# Patient Record
Sex: Female | Born: 1990 | Race: Black or African American | Hispanic: No | Marital: Single | Smoking: Never smoker
Health system: Southern US, Community
[De-identification: ages and names within clinical notes are randomized; demographics above are authoritative.]

## PROBLEM LIST (undated history)

## (undated) ENCOUNTER — Inpatient Hospital Stay (HOSPITAL_COMMUNITY): Payer: Self-pay

## (undated) DIAGNOSIS — J45909 Unspecified asthma, uncomplicated: Secondary | ICD-10-CM

## (undated) DIAGNOSIS — G35 Multiple sclerosis: Secondary | ICD-10-CM

---
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
## 2010-02-26 HISTORY — PX: OTHER SURGICAL HISTORY: SHX169

## 2015-02-27 NOTE — L&D Delivery Note (Signed)
Delivery Note At 3:02 AM a viable female was delivered via Vaginal, Spontaneous Delivery (Presentation: vertex; OA).  APGAR: 9, 9; weight pending.   Placenta status: spontaneous, intact.  Cord: 3 vessel with the following complications: none.  Cord pH: not obtained  Anesthesia:  Epidural Episiotomy: None Lacerations: None;Periurethral abraison, not repaired. Suture Repair: n/a Est. Blood Loss (mL): 50  Mom to postpartum.  Baby to Couplet care / Skin to Skin.  Jinny Blossom Mayo 12/10/2015, 3:14 AM  Patient is a [id number] at [redacted]w[redacted]d who was admitted in active labor, significant hx of elevated glucola with no GTT, maternal MS.  She progressed without augmentation.  I was gloved and present for delivery in its entirety.  Second stage of labor progressed to SVD;  mild decels during second stage noted.  Complications: none  Lacerations: none  EBL: 50cc  Cam Hai, CNM 9:55 AM  12/10/2015

## 2015-10-18 ENCOUNTER — Encounter (HOSPITAL_COMMUNITY): Payer: Self-pay

## 2015-10-18 ENCOUNTER — Inpatient Hospital Stay (HOSPITAL_COMMUNITY)
Admission: AD | Admit: 2015-10-18 | Discharge: 2015-10-18 | Disposition: A | Discharge status: Home / Self Care | Payer: Self-pay | Source: Ambulatory Visit | Attending: Family Medicine | Admitting: Family Medicine

## 2015-10-18 DIAGNOSIS — O99613 Diseases of the digestive system complicating pregnancy, third trimester: Secondary | ICD-10-CM

## 2015-10-18 DIAGNOSIS — Z3A31 31 weeks gestation of pregnancy: Secondary | ICD-10-CM | POA: Insufficient documentation

## 2015-10-18 DIAGNOSIS — O26893 Other specified pregnancy related conditions, third trimester: Secondary | ICD-10-CM | POA: Insufficient documentation

## 2015-10-18 DIAGNOSIS — R1032 Left lower quadrant pain: Secondary | ICD-10-CM | POA: Insufficient documentation

## 2015-10-18 DIAGNOSIS — Z91013 Allergy to seafood: Secondary | ICD-10-CM | POA: Insufficient documentation

## 2015-10-18 DIAGNOSIS — K529 Noninfective gastroenteritis and colitis, unspecified: Secondary | ICD-10-CM

## 2015-10-18 DIAGNOSIS — O26899 Other specified pregnancy related conditions, unspecified trimester: Secondary | ICD-10-CM

## 2015-10-18 DIAGNOSIS — O0933 Supervision of pregnancy with insufficient antenatal care, third trimester: Secondary | ICD-10-CM

## 2015-10-18 DIAGNOSIS — G35 Multiple sclerosis: Secondary | ICD-10-CM | POA: Insufficient documentation

## 2015-10-18 DIAGNOSIS — R109 Unspecified abdominal pain: Secondary | ICD-10-CM

## 2015-10-18 DIAGNOSIS — O99353 Diseases of the nervous system complicating pregnancy, third trimester: Secondary | ICD-10-CM | POA: Insufficient documentation

## 2015-10-18 DIAGNOSIS — R197 Diarrhea, unspecified: Secondary | ICD-10-CM

## 2015-10-18 DIAGNOSIS — O99019 Anemia complicating pregnancy, unspecified trimester: Secondary | ICD-10-CM

## 2015-10-18 DIAGNOSIS — D649 Anemia, unspecified: Secondary | ICD-10-CM | POA: Insufficient documentation

## 2015-10-18 DIAGNOSIS — O99013 Anemia complicating pregnancy, third trimester: Secondary | ICD-10-CM | POA: Insufficient documentation

## 2015-10-18 DIAGNOSIS — R1031 Right lower quadrant pain: Secondary | ICD-10-CM | POA: Insufficient documentation

## 2015-10-18 HISTORY — DX: Multiple sclerosis: G35

## 2015-10-18 HISTORY — DX: Unspecified asthma, uncomplicated: J45.909

## 2015-10-18 LAB — CBC WITH DIFFERENTIAL/PLATELET
Basophils Absolute: 0 10*3/uL (ref 0.0–0.1)
Basophils Relative: 0 %
Eosinophils Absolute: 0.1 10*3/uL (ref 0.0–0.7)
Eosinophils Relative: 1 %
HCT: 27.2 % — ABNORMAL LOW (ref 36.0–46.0)
Hemoglobin: 9.3 g/dL — ABNORMAL LOW (ref 12.0–15.0)
Lymphocytes Relative: 29 %
Lymphs Abs: 2.9 10*3/uL (ref 0.7–4.0)
MCH: 28.7 pg (ref 26.0–34.0)
MCHC: 34.2 g/dL (ref 30.0–36.0)
MCV: 84 fL (ref 78.0–100.0)
Monocytes Absolute: 0.6 10*3/uL (ref 0.1–1.0)
Monocytes Relative: 6 %
Neutro Abs: 6.4 10*3/uL (ref 1.7–7.7)
Neutrophils Relative %: 64 %
Platelets: 287 10*3/uL (ref 150–400)
RBC: 3.24 MIL/uL — ABNORMAL LOW (ref 3.87–5.11)
RDW: 13.4 % (ref 11.5–15.5)
WBC: 10 10*3/uL (ref 4.0–10.5)

## 2015-10-18 LAB — URINALYSIS, ROUTINE W REFLEX MICROSCOPIC
Bilirubin Urine: NEGATIVE
Glucose, UA: 100 mg/dL — AB
Hgb urine dipstick: NEGATIVE
Ketones, ur: NEGATIVE mg/dL
Leukocytes, UA: NEGATIVE
Nitrite: NEGATIVE
Protein, ur: NEGATIVE mg/dL
Specific Gravity, Urine: >1.03 — ABNORMAL HIGH (ref 1.005–1.030)
pH: 6 (ref 5.0–8.0)

## 2015-10-18 MED ORDER — FERROUS SULFATE 325 (65 FE) MG PO TABS: 325.0000 mg | ORAL_TABLET | Freq: Two times a day (BID) | ORAL | 1 refills | Status: DC

## 2015-10-18 NOTE — MAU Note (Addendum)
Patient presents with c/o lower sharp abdominal pains that started yesterday at 2100. Patient states the pain was constant this morning but now comes and goes. Patient denies any bleeding or LOF. Fetus active. Patient also states that she has been SOB for the past 2 weeks.

## 2015-10-18 NOTE — Discharge Instructions (Signed)
Third Trimester of Pregnancy °The third trimester is from week 29 through week 42, months 7 through 9. The third trimester is a time when the fetus is growing rapidly. At the end of the ninth month, the fetus is about 20 inches in length and weighs 6-10 pounds.  °BODY CHANGES °Your body goes through many changes during pregnancy. The changes vary from woman to woman.  °· Your weight will continue to increase. You can expect to gain 25-35 pounds (11-16 kg) by the end of the pregnancy. °· You may begin to get stretch marks on your hips, abdomen, and breasts. °· You may urinate more often because the fetus is moving lower into your pelvis and pressing on your bladder. °· You may develop or continue to have heartburn as a result of your pregnancy. °· You may develop constipation because certain hormones are causing the muscles that push waste through your intestines to slow down. °· You may develop hemorrhoids or swollen, bulging veins (varicose veins). °· You may have pelvic pain because of the weight gain and pregnancy hormones relaxing your joints between the bones in your pelvis. Backaches may result from overexertion of the muscles supporting your posture. °· You may have changes in your hair. These can include thickening of your hair, rapid growth, and changes in texture. Some women also have hair loss during or after pregnancy, or hair that feels dry or thin. Your hair will most likely return to normal after your baby is born. °· Your breasts will continue to grow and be tender. A yellow discharge may leak from your breasts called colostrum. °· Your belly button may stick out. °· You may feel short of breath because of your expanding uterus. °· You may notice the fetus "dropping," or moving lower in your abdomen. °· You may have a bloody mucus discharge. This usually occurs a few days to a week before labor begins. °· Your cervix becomes thin and soft (effaced) near your due date. °WHAT TO EXPECT AT YOUR PRENATAL  EXAMS  °You will have prenatal exams every 2 weeks until week 36. Then, you will have weekly prenatal exams. During a routine prenatal visit: °· You will be weighed to make sure you and the fetus are growing normally. °· Your blood pressure is taken. °· Your abdomen will be measured to track your baby's growth. °· The fetal heartbeat will be listened to. °· Any test results from the previous visit will be discussed. °· You may have a cervical check near your due date to see if you have effaced. °At around 36 weeks, your caregiver will check your cervix. At the same time, your caregiver will also perform a test on the secretions of the vaginal tissue. This test is to determine if a type of bacteria, Group B streptococcus, is present. Your caregiver will explain this further. °Your caregiver may ask you: °· What your birth plan is. °· How you are feeling. °· If you are feeling the baby move. °· If you have had any abnormal symptoms, such as leaking fluid, bleeding, severe headaches, or abdominal cramping. °· If you are using any tobacco products, including cigarettes, chewing tobacco, and electronic cigarettes. °· If you have any questions. °Other tests or screenings that may be performed during your third trimester include: °· Blood tests that check for low iron levels (anemia). °· Fetal testing to check the health, activity level, and growth of the fetus. Testing is done if you have certain medical conditions or if   there are problems during the pregnancy. °· HIV (human immunodeficiency virus) testing. If you are at high risk, you may be screened for HIV during your third trimester of pregnancy. °FALSE LABOR °You may feel small, irregular contractions that eventually go away. These are called Braxton Hicks contractions, or false labor. Contractions may last for hours, days, or even weeks before true labor sets in. If contractions come at regular intervals, intensify, or become painful, it is best to be seen by your  caregiver.  °SIGNS OF LABOR  °· Menstrual-like cramps. °· Contractions that are 5 minutes apart or less. °· Contractions that start on the top of the uterus and spread down to the lower abdomen and back. °· A sense of increased pelvic pressure or back pain. °· A watery or bloody mucus discharge that comes from the vagina. °If you have any of these signs before the 37th week of pregnancy, call your caregiver right away. You need to go to the hospital to get checked immediately. °HOME CARE INSTRUCTIONS  °· Avoid all smoking, herbs, alcohol, and unprescribed drugs. These chemicals affect the formation and growth of the baby. °· Do not use any tobacco products, including cigarettes, chewing tobacco, and electronic cigarettes. If you need help quitting, ask your health care provider. You may receive counseling support and other resources to help you quit. °· Follow your caregiver's instructions regarding medicine use. There are medicines that are either safe or unsafe to take during pregnancy. °· Exercise only as directed by your caregiver. Experiencing uterine cramps is a good sign to stop exercising. °· Continue to eat regular, healthy meals. °· Wear a good support bra for breast tenderness. °· Do not use hot tubs, steam rooms, or saunas. °· Wear your seat belt at all times when driving. °· Avoid raw meat, uncooked cheese, cat litter boxes, and soil used by cats. These carry germs that can cause birth defects in the baby. °· Take your prenatal vitamins. °· Take 1500-2000 mg of calcium daily starting at the 20th week of pregnancy until you deliver your baby. °· Try taking a stool softener (if your caregiver approves) if you develop constipation. Eat more high-fiber foods, such as fresh vegetables or fruit and whole grains. Drink plenty of fluids to keep your urine clear or pale yellow. °· Take warm sitz baths to soothe any pain or discomfort caused by hemorrhoids. Use hemorrhoid cream if your caregiver approves. °· If  you develop varicose veins, wear support hose. Elevate your feet for 15 minutes, 3-4 times a day. Limit salt in your diet. °· Avoid heavy lifting, wear low heal shoes, and practice good posture. °· Rest a lot with your legs elevated if you have leg cramps or low back pain. °· Visit your dentist if you have not gone during your pregnancy. Use a soft toothbrush to brush your teeth and be gentle when you floss. °· A sexual relationship may be continued unless your caregiver directs you otherwise. °· Do not travel far distances unless it is absolutely necessary and only with the approval of your caregiver. °· Take prenatal classes to understand, practice, and ask questions about the labor and delivery. °· Make a trial run to the hospital. °· Pack your hospital bag. °· Prepare the baby's nursery. °· Continue to go to all your prenatal visits as directed by your caregiver. °SEEK MEDICAL CARE IF: °· You are unsure if you are in labor or if your water has broken. °· You have dizziness. °· You have   mild pelvic cramps, pelvic pressure, or nagging pain in your abdominal area.  You have persistent nausea, vomiting, or diarrhea.  You have a bad smelling vaginal discharge.  You have pain with urination. SEEK IMMEDIATE MEDICAL CARE IF:   You have a fever.  You are leaking fluid from your vagina.  You have spotting or bleeding from your vagina.  You have severe abdominal cramping or pain.  You have rapid weight loss or gain.  You have shortness of breath with chest pain.  You notice sudden or extreme swelling of your face, hands, ankles, feet, or legs.  You have not felt your baby move in over an hour.  You have severe headaches that do not go away with medicine.  You have vision changes.   This information is not intended to replace advice given to you by your health care provider. Make sure you discuss any questions you have with your health care provider.   Document Released: 02/06/2001 Document  Revised: 03/05/2014 Document Reviewed: 04/15/2012 Elsevier Interactive Patient Education 2016 [employer] Diarrhea Diarrhea is frequent loose and watery bowel movements. It can cause you to feel weak and dehydrated. Dehydration can cause you to become tired and thirsty, have a dry mouth, and have decreased urination that often is dark yellow. Diarrhea is a sign of another problem, most often an infection that will not last long. In most cases, diarrhea typically lasts 2-3 days. However, it can last longer if it is a sign of something more serious. It is important to treat your diarrhea as directed by your caregiver to lessen or prevent future episodes of diarrhea. CAUSES  Some common causes include:  Gastrointestinal infections caused by viruses, bacteria, or parasites.  Food poisoning or food allergies.  Certain medicines, such as antibiotics, chemotherapy, and laxatives.  Artificial sweeteners and fructose.  Digestive disorders. HOME CARE INSTRUCTIONS  Ensure adequate fluid intake (hydration): Have 1 cup (8 oz) of fluid for each diarrhea episode. Avoid fluids that contain simple sugars or sports drinks, fruit juices, whole milk products, and sodas. Your urine should be clear or pale yellow if you are drinking enough fluids. Hydrate with an oral rehydration solution that you can purchase at pharmacies, retail stores, and online. You can prepare an oral rehydration solution at home by mixing the following ingredients together:   - tsp table salt.   tsp baking soda.   tsp salt substitute containing potassium chloride.  1  tablespoons sugar.  1 L (34 oz) of water.  Certain foods and beverages may increase the speed at which food moves through the gastrointestinal (GI) tract. These foods and beverages should be avoided and include:  Caffeinated and alcoholic beverages.  High-fiber foods, such as raw fruits and vegetables, nuts, seeds, and whole grain breads and cereals.  Foods  and beverages sweetened with sugar alcohols, such as xylitol, sorbitol, and mannitol.  Some foods may be well tolerated and may help thicken stool including:  Starchy foods, such as rice, toast, pasta, low-sugar cereal, oatmeal, grits, baked potatoes, crackers, and bagels.  Bananas.  Applesauce.  Add probiotic-rich foods to help increase healthy bacteria in the GI tract, such as yogurt and fermented milk products.  Wash your hands well after each diarrhea episode.  Only take over-the-counter or prescription medicines as directed by your caregiver.  Take a warm bath to relieve any burning or pain from frequent diarrhea episodes. SEEK IMMEDIATE MEDICAL CARE IF:   You are unable to keep fluids down.  You have  persistent vomiting.  You have blood in your stool, or your stools are black and tarry.  You do not urinate in 6-8 hours, or there is only a small amount of very dark urine.  You have abdominal pain that increases or localizes.  You have weakness, dizziness, confusion, or light-headedness.  You have a severe headache.  Your diarrhea gets worse or does not get better.  You have a fever or persistent symptoms for more than 2-3 days.  You have a fever and your symptoms suddenly get worse. MAKE SURE YOU:   Understand these instructions.  Will watch your condition.  Will get help right away if you are not doing well or get worse.   This information is not intended to replace advice given to you by your health care provider. Make sure you discuss any questions you have with your health care provider.   Document Released: 02/02/2002 Document Revised: 03/05/2014 Document Reviewed: 10/21/2011 Elsevier Interactive Patient Education [employer]

## 2015-10-18 NOTE — MAU Provider Note (Signed)
Chief Complaint:  Abdominal Pain   Seen by provider at 1510 hours   HPI: Molly MansonJewel Moreno is a 25 y.o. [phone] [id number] at [id number] presents to maternity admissions reporting abdominal pains which are sharp and located in right lower side.  Good FM.  Some shortness of breath at times, but not dyspneic.  She reports good fetal movement, denies LOF, vaginal bleeding, vaginal itching/burning, urinary symptoms, h/a, dizziness, n/v, constipation or fever/chills.  States had two loose stools.  She denies headache, visual changes or RUQ abdominal pain.    Got prenatal care in [place] until 24 weeks then stopped. Moved here. Has not gotten care yet.  States [facility] told her not to bother since it was so late, "you should just wait until you go into labor". States her husband does not want to sign up for "public assistance", so she is self-pay, worried about costs.  Abdominal Cramping  This is a new problem. The current episode started 1 to 4 weeks ago. The onset quality is gradual. The problem occurs intermittently. The problem has been waxing and waning. The pain is located in the LLQ and RLQ. The quality of the pain is cramping. The abdominal pain does not radiate. Pertinent negatives include no constipation, diarrhea, fever, frequency, hematuria, myalgias, nausea or vomiting. The pain is aggravated by palpation. The pain is relieved by nothing. She has tried nothing for the symptoms.    RN Note: Patient presents with c/o lower sharp abdominal pains that started yesterday at 2100. Patient states the pain was constant this morning but now comes and goes. Patient denies any bleeding or LOF. Fetus active. Patient also states that she has been SOB for the past 2 weeks  Past Medical History: Past Medical History:  Diagnosis Date  . Asthma   . Multiple sclerosis (HCC)     Past obstetric history: OB History  Gravida Para Term Preterm AB Living  4       1 2   SAB TAB Ectopic Multiple Live Births      1         # Outcome Date GA Lbr Len/2nd Weight Sex Delivery Anes PTL Lv  4 Current           3 Gravida           2 Gravida           1 Ectopic               Past Surgical History: History reviewed. No pertinent surgical history.  Family History: History reviewed. No pertinent family history.  Social History: Social History  Substance Use Topics  . Smoking status: Never Smoker  . Smokeless tobacco: Never Used  . Alcohol use Not on file    Allergies:  Allergies  Allergen Reactions  . Shellfish Allergy Swelling    Meds:  Prescriptions Prior to Admission  Medication Sig Dispense Refill Last Dose  . calcium carbonate (TUMS - DOSED IN MG ELEMENTAL CALCIUM) 500 MG chewable tablet Chew 2 tablets by mouth 2 (two) times daily as needed for indigestion or heartburn.   10/18/2015 at Unknown time  . Prenatal Vit-Fe Fumarate-FA (PRENATAL MULTIVITAMIN) TABS tablet Take 1 tablet by mouth daily at 12 noon.   10/17/2015 at Unknown time    I have reviewed patient's Past Medical Hx, Surgical Hx, Family Hx, Social Hx, medications and allergies.   ROS:  Review of Systems  Constitutional: Negative for fever.  Gastrointestinal: Negative for constipation, diarrhea, nausea and vomiting.  Genitourinary: Negative for frequency and hematuria.  Musculoskeletal: Negative for myalgias.   Other systems negative  Physical Exam  Patient Vitals for the past 24 hrs:  BP Temp Temp src Pulse Resp  10/18/15 1519 115/71 97.6 F (36.4 C) Oral 102 18   Constitutional: Well-developed, well-nourished female in no acute distress.  Cardiovascular: normal rate and rhythm Respiratory: normal effort, clear to auscultation bilaterally GI: Abd soft, non-tender except for over round ligaments,  gravid appropriate for gestational age.   No rebound or guarding. MS: Extremities nontender, no edema, normal ROM Neurologic: Alert and oriented x 4.  GU: Neg CVAT.  PELVIC EXAM: Cervix pink, visually closed, without lesion,  scant white creamy discharge, vaginal walls and external genitalia normal  FHT:  Baseline 130 , moderate variability, accelerations present, no decelerations Contractions:   Rare   Labs:    Results for orders placed or performed during the hospital encounter of 10/18/15 (from the past 72 hour(s))  Urinalysis, Routine w reflex microscopic (not at [facility])     Status: Abnormal   Collection Time: 10/18/15  3:07 PM  Result Value Ref Range   Color, Urine YELLOW YELLOW   APPearance CLEAR CLEAR   Specific Gravity, Urine >1.030 (H) 1.005 - 1.030   pH 6.0 5.0 - 8.0   Glucose, UA 100 (A) NEGATIVE mg/dL   Hgb urine dipstick NEGATIVE NEGATIVE   Bilirubin Urine NEGATIVE NEGATIVE   Ketones, ur NEGATIVE NEGATIVE mg/dL   Protein, ur NEGATIVE NEGATIVE mg/dL   Nitrite NEGATIVE NEGATIVE   Leukocytes, UA NEGATIVE NEGATIVE    Comment: MICROSCOPIC NOT DONE ON URINES WITH NEGATIVE PROTEIN, BLOOD, LEUKOCYTES, NITRITE, OR GLUCOSE <1000 mg/dL.  CBC with Differential/Platelet     Status: Abnormal   Collection Time: 10/18/15  4:25 PM  Result Value Ref Range   WBC 10.0 4.0 - 10.5 K/uL   RBC 3.24 (L) 3.87 - 5.11 MIL/uL   Hemoglobin 9.3 (L) 12.0 - 15.0 g/dL   HCT 94.1 (L) 74.0 - 81.4 %   MCV 84.0 78.0 - 100.0 fL   MCH 28.7 26.0 - 34.0 pg   MCHC 34.2 30.0 - 36.0 g/dL   RDW 48.1 85.6 - 31.4 %   Platelets 287 150 - 400 K/uL   Neutrophils Relative % 64 %   Neutro Abs 6.4 1.7 - 7.7 K/uL   Lymphocytes Relative 29 %   Lymphs Abs 2.9 0.7 - 4.0 K/uL   Monocytes Relative 6 %   Monocytes Absolute 0.6 0.1 - 1.0 K/uL   Eosinophils Relative 1 %   Eosinophils Absolute 0.1 0.0 - 0.7 K/uL   Basophils Relative 0 %   Basophils Absolute 0.0 0.0 - 0.1 K/uL     Imaging:  No results found.  MAU Course/MDM: I have ordered labs and reviewed results. Checked CBC and urine to rule out appendicitis and UTI BOth normal and no overt evidence of infectious etiology Suspect round ligament pain NST reviewed  Assessment: [facility]  at [redacted]w[redacted]d Possible food poisoning or gastroenteritis, resolving Abdominal pain, likely round ligament pain Anemia  Plan: Discharge home Rx Ferrous sulfate for anemia Preterm Labor precautions and fetal kick counts Follow up in Office for prenatal visits and recheck of status Message sent to clinic to get new ob appt  Pt stable at time of discharge  Encouraged to return here or to other Urgent Care/ED if she develops worsening of symptoms, increase in pain, fever, or other concerning symptoms.    Wynelle Bourgeois CNM, MSN Certified  Nurse-Midwife 10/18/2015 4:22 PM

## 2015-10-21 ENCOUNTER — Encounter: Payer: Self-pay | Admitting: *Deleted

## 2015-11-09 ENCOUNTER — Ambulatory Visit (INDEPENDENT_AMBULATORY_CARE_PROVIDER_SITE_OTHER): Payer: Self-pay | Admitting: Clinical

## 2015-11-09 ENCOUNTER — Ambulatory Visit (INDEPENDENT_AMBULATORY_CARE_PROVIDER_SITE_OTHER): Payer: Self-pay | Admitting: Obstetrics and Gynecology

## 2015-11-09 ENCOUNTER — Encounter: Payer: Self-pay | Admitting: Obstetrics and Gynecology

## 2015-11-09 VITALS — BP 112/61 | HR 90 | Ht 59.0 in | Wt 173.0 lb

## 2015-11-09 DIAGNOSIS — D649 Anemia, unspecified: Secondary | ICD-10-CM

## 2015-11-09 DIAGNOSIS — G35 Multiple sclerosis: Secondary | ICD-10-CM

## 2015-11-09 DIAGNOSIS — O2343 Unspecified infection of urinary tract in pregnancy, third trimester: Secondary | ICD-10-CM

## 2015-11-09 DIAGNOSIS — Z113 Encounter for screening for infections with a predominantly sexual mode of transmission: Secondary | ICD-10-CM

## 2015-11-09 DIAGNOSIS — R82998 Other abnormal findings in urine: Secondary | ICD-10-CM

## 2015-11-09 DIAGNOSIS — Z3490 Encounter for supervision of normal pregnancy, unspecified, unspecified trimester: Secondary | ICD-10-CM | POA: Insufficient documentation

## 2015-11-09 DIAGNOSIS — O99353 Diseases of the nervous system complicating pregnancy, third trimester: Secondary | ICD-10-CM

## 2015-11-09 DIAGNOSIS — O99019 Anemia complicating pregnancy, unspecified trimester: Secondary | ICD-10-CM | POA: Insufficient documentation

## 2015-11-09 DIAGNOSIS — F4323 Adjustment disorder with mixed anxiety and depressed mood: Secondary | ICD-10-CM

## 2015-11-09 DIAGNOSIS — O99013 Anemia complicating pregnancy, third trimester: Secondary | ICD-10-CM

## 2015-11-09 LAB — CBC
HCT: 29.3 % — ABNORMAL LOW (ref 35.0–45.0)
Hemoglobin: 9.5 g/dL — ABNORMAL LOW (ref 11.7–15.5)
MCH: 27.5 pg (ref 27.0–33.0)
MCHC: 32.4 g/dL (ref 32.0–36.0)
MCV: 84.7 fL (ref 80.0–100.0)
MPV: 10 fL (ref 7.5–12.5)
PLATELETS: 328 10*3/uL (ref 140–400)
RBC: 3.46 MIL/uL — AB (ref 3.80–5.10)
RDW: 13.7 % (ref 11.0–15.0)
WBC: 9.3 10*3/uL (ref 3.8–10.8)

## 2015-11-09 LAB — POCT URINALYSIS DIP (DEVICE)
Bilirubin Urine: NEGATIVE
Glucose, UA: NEGATIVE mg/dL
Hgb urine dipstick: NEGATIVE
Ketones, ur: NEGATIVE mg/dL
Nitrite: NEGATIVE
Protein, ur: NEGATIVE mg/dL
Specific Gravity, Urine: 1.025 (ref 1.005–1.030)
Urobilinogen, UA: 0.2 mg/dL (ref 0.0–1.0)
pH: 7 (ref 5.0–8.0)

## 2015-11-09 NOTE — Progress Notes (Deleted)
  Subjective:    Molly Moreno is a [id number] [redacted]w[redacted]d being seen today for her first obstetrical visit.  Her obstetrical history is significant for anemia; she is a transfer of care from [place], records have been requested. Her prenatal history includes one full term vaginal delivery and one preterm vaginal delivery @ 36 weeks. Patient brought with her today some labs that were done at previous prenatal visit. Patient states she had one [place] between 18 weeks and 19 weeks and it was "normal". . Patient unsure intend to breast feed. Pregnancy history fully reviewed.  Patient reports no complaints.  Vitals:   11/09/15 0821 11/09/15 0823  BP: 112/61   Pulse: 90   Weight: 173 lb (78.5 kg)   Height:  4\' 11"  (1.499 m)    HISTORY: OB History  Gravida Para Term Preterm AB Living  5 2 1 1 2 2   SAB TAB Ectopic Multiple Live Births    1 1   2     # Outcome Date GA Lbr Len/2nd Weight Sex Delivery Anes PTL Lv  5 Current           4 TAB 2015          3 Ectopic 2012          2 Term 09/07/09 [redacted]w[redacted]d   F Vag-Spont EPI N LIV  1 Preterm 05/19/05 [redacted]w[redacted]d   M Vag-Spont None Y LIV     Past Medical History:  Diagnosis Date  . Asthma   . Multiple sclerosis [facility])    Past Surgical History:  Procedure Laterality Date  . salphinectomy Right 2012   ruptured ectopic   Family History  Problem Relation Age of Onset  . Cancer Maternal Grandmother     colon  . Cancer Maternal Grandfather     colon    Exam    Uterus:  Fundal Height: 33 cm   Breast:  Deferred    Skin: normal coloration and turgor, no rashes    Neurologic: oriented, normal   Extremities: normal strength, tone, and muscle mass   HEENT PERRLA   Mouth/Teeth mucous membranes moist, pharynx normal without lesions   Neck supple and no masses   Cardiovascular: regular rate and rhythm   Respiratory:  appears well, vitals normal, no respiratory distress, acyanotic, normal RR","ear and throat exam is normal","neck free of mass or  lymphadenopathy","chest clear, no wheezing, crepitations, rhonchi, normal symmetric air entry   Abdomen: soft, non-tender; bowel sounds normal; no masses,  no organomegaly   Urinary: deferred       Assessment:    Pregnancy: [id number] Patient Active Problem List   Diagnosis Date Noted  . Supervision of low-risk pregnancy 11/09/2015  . Anemia affecting pregnancy, antepartum 11/09/2015    Plan:    1. Supervision of low-risk pregnancy, unspecified trimester  - Glucose Tolerance, 1 HR (50g) - HIV antibody (with reflex) - CBC - RPR - Urine Culture - GC/Chlamydia probe amp ([place])not at [facility]  2. Leukocytes in urine  - Urine Culture  Prenatal vitamins. Problem list reviewed and updated. Genetic Screening Records requested from [place], [place] to call and request records faxed to [facility] Preterm labor precautions  Return in 2 weeks    Duane Lope, NP

## 2015-11-09 NOTE — Progress Notes (Signed)
Subjective:    Molly Moreno is a [id number] @ [redacted]w[redacted]d being seen today for her first obstetrical visit at [facility].  Her obstetrical history is significant for anemia, multiple sclerosis HCC, and asthma. She is a transfer of care from [facility], records have been requested. Her last reported visit with them was at 24 weeks.  Her prenatal history includes one full term vaginal delivery and one preterm vaginal delivery @ 36 weeks. Patient states she had one [place] between 18 or 19 weeks and it was "normal". . Patient intends to breast feed. Pregnancy history fully reviewed.  Patient reports no complaints.  Vitals:   11/09/15 0821 11/09/15 0823  BP: 112/61   Pulse: 90   Weight: 173 lb (78.5 kg)   Height:  4\' 11"  (1.499 m)    HISTORY: OB History  Gravida Para Term Preterm AB Living  5 2 1 1 2 2   SAB TAB Ectopic Multiple Live Births    1 1   2     # Outcome Date GA Lbr Len/2nd Weight Sex Delivery Anes PTL Lv  5 Current           4 TAB 2015          3 Ectopic 2012          2 Term 09/07/09 [redacted]w[redacted]d   F Vag-Spont EPI N LIV  1 Preterm 05/19/05 [redacted]w[redacted]d   M Vag-Spont None Y LIV     Past Medical History:  Diagnosis Date  . Asthma   . Multiple sclerosis [facility])    Past Surgical History:  Procedure Laterality Date  . salphinectomy Right 2012   ruptured ectopic   Family History  Problem Relation Age of Onset  . Cancer Maternal Grandmother     colon  . Cancer Maternal Grandfather     colon    Exam    Uterus:  Fundal Height: 33 cm   Breast:  Deferred    Skin: normal coloration and turgor, no rashes    Neurologic: oriented, normal   Extremities: normal strength, tone, and muscle mass   HEENT PERRLA   Mouth/Teeth mucous membranes moist, pharynx normal without lesions   Neck supple and no masses   Cardiovascular: regular rate and rhythm   Respiratory:  appears well, vitals normal, no respiratory distress, acyanotic, normal RR","ear and throat exam is normal","neck free of mass or  lymphadenopathy","chest clear, no wheezing, crepitations, rhonchi, normal symmetric air entry   Abdomen: soft, non-tender; bowel sounds normal; no masses,  no organomegaly   Urinary: deferred       Assessment:    Pregnancy: [id number] Patient Active Problem List   Diagnosis Date Noted  . Supervision of low-risk pregnancy 11/09/2015  . Anemia affecting pregnancy, antepartum 11/09/2015  . MS (multiple sclerosis) (HCC) 11/09/2015    Plan:    1. Supervision of low-risk pregnancy, unspecified trimester - Glucose Tolerance, 1 HR (50g) - HIV antibody (with reflex) - CBC - RPR - Urine Culture - GC/Chlamydia probe amp ([place])not at [facility] - She is self pay and is concerned about not having records from [place] and having to possibly repeat labs/ [place]   2. Leukocytes in urine - Urine Culture  3. Anemia affecting pregnancy  - Continue PO iron   4.  Multiple Sclerosis HCC - Patient not interested in seeing neurology for treatment. She would like to discuss this more after pregnancy.  - Father of the baby has MS as well   Prenatal vitamins. Problem list reviewed and  updated. [place] [facility] Preterm labor precautions  Return in 2 weeks    Duane LopeJennifer I Jarquis Walker, NP

## 2015-11-09 NOTE — BH Specialist Note (Signed)
  ASSESSMENT: Pt currently experiencing Adjustment disorder with mixed anxious and depressed mood.Pt needs to f/u with OB. Pt would benefit from psychoeducation and brief therapeutic intervention regarding coping with symptoms of anxiety and depression.  Stage of Change: contemplative  PLAN: 1. F/U with behavioral health clinician as needed 2. Psychiatric Medications: none 3. Behavioral recommendations:   -Read educational material regarding coping with mild symptoms of anxiety and depression -Consider [facility] as resource for questions about BH meds and postpartum, as needed -Consider MomTalk mom-led support group at [facility], postpartum, as needed  SUBJECTIVE: Pt. referred by Venia Carbon, NP, for symptoms of anxiety and depression Pt. reports the following symptoms/concerns: Pt states that she has previously been treated for anxiety and depression prior to pregnancy; attributes recent increase in symptoms to third trimester pregnancy, but coping well without meds. Duration of problem: Increase in past month Severity: mild   OBJECTIVE: Orientation & Cognition: Oriented x3. Thought processes normal and appropriate to situation. Mood: appropriate Affect: appropriate Appearance: appropriate Risk of harm to self or others: no known risk of harm to self or others Substance use: none Assessments administered: PHQ: 12/ GAD7: 12  Diagnosis: Adjustment disorder with mixed anxious and depressed mood CPT Code: F43.23  -------------------------------------------- Other(s) present in the room: FOB  Time spent with patient in exam room: 16 minutes, 9-9:16am  Depression screen [facility] 2/9 11/09/2015  Decreased Interest 3  Down, Depressed, Hopeless 0  PHQ - 2 Score 3  Altered sleeping 3  Tired, decreased energy 3  Change in appetite 1  Feeling bad or failure about yourself  0  Trouble concentrating 1  Moving slowly or fidgety/restless 1  Suicidal thoughts 0  PHQ-9 Score 12    GAD 7 : Generalized Anxiety Score 11/09/2015  Control/stop worrying 1  Worry too much - different things 1  Trouble relaxing 3  Restless 3  Easily annoyed or irritable 3  Afraid - awful might happen 1

## 2015-11-09 NOTE — Progress Notes (Signed)
Declines flu vaccine, wants to think about tdap vaccine Plans to breastfeed

## 2015-11-09 NOTE — Patient Instructions (Signed)

## 2015-11-10 LAB — GC/CHLAMYDIA PROBE AMP (~~LOC~~) NOT AT ARMC
CHLAMYDIA, DNA PROBE: NEGATIVE
NEISSERIA GONORRHEA: NEGATIVE

## 2015-11-10 LAB — RPR

## 2015-11-10 LAB — GLUCOSE TOLERANCE, 1 HOUR (50G) W/O FASTING: GLUCOSE, 1 HR, GESTATIONAL: 150 mg/dL — AB (ref <140)

## 2015-11-10 LAB — URINE CULTURE

## 2015-11-10 LAB — HIV ANTIBODY (ROUTINE TESTING W REFLEX): HIV: NONREACTIVE

## 2015-11-11 ENCOUNTER — Telehealth: Payer: Self-pay | Admitting: *Deleted

## 2015-11-11 NOTE — Telephone Encounter (Signed)
Called patient and notified her of gtt results and need for 3 hour test. Explained that this will be a fasting test. Patient voiced understanding and said she could come in on Monday @ 8am.

## 2015-11-11 NOTE — Telephone Encounter (Signed)
-----   Message from Duane Lope, NP sent at 11/10/2015  1:12 PM EDT ----- Abnormal 1 hour GTT, She needs a 3 hour GTT She is already 34 weeks, can we schedule her as soon as possible please?  Thank you.

## 2015-11-15 ENCOUNTER — Other Ambulatory Visit: Payer: Self-pay

## 2015-11-29 ENCOUNTER — Inpatient Hospital Stay (HOSPITAL_COMMUNITY)
Admission: AD | Admit: 2015-11-29 | Discharge: 2015-11-29 | Disposition: A | Discharge status: Home / Self Care | Payer: Self-pay | Source: Ambulatory Visit | Attending: Obstetrics and Gynecology | Admitting: Obstetrics and Gynecology

## 2015-11-29 ENCOUNTER — Ambulatory Visit (INDEPENDENT_AMBULATORY_CARE_PROVIDER_SITE_OTHER): Payer: Self-pay | Admitting: Advanced Practice Midwife

## 2015-11-29 VITALS — BP 136/62 | HR 102 | Wt 178.0 lb

## 2015-11-29 DIAGNOSIS — Z3490 Encounter for supervision of normal pregnancy, unspecified, unspecified trimester: Secondary | ICD-10-CM

## 2015-11-29 DIAGNOSIS — Z113 Encounter for screening for infections with a predominantly sexual mode of transmission: Secondary | ICD-10-CM

## 2015-11-29 DIAGNOSIS — O99353 Diseases of the nervous system complicating pregnancy, third trimester: Secondary | ICD-10-CM

## 2015-11-29 DIAGNOSIS — O99013 Anemia complicating pregnancy, third trimester: Secondary | ICD-10-CM

## 2015-11-29 DIAGNOSIS — D649 Anemia, unspecified: Secondary | ICD-10-CM

## 2015-11-29 DIAGNOSIS — G35 Multiple sclerosis: Secondary | ICD-10-CM

## 2015-11-29 DIAGNOSIS — Z3A36 36 weeks gestation of pregnancy: Secondary | ICD-10-CM | POA: Insufficient documentation

## 2015-11-29 DIAGNOSIS — Z3493 Encounter for supervision of normal pregnancy, unspecified, third trimester: Secondary | ICD-10-CM

## 2015-11-29 DIAGNOSIS — O2343 Unspecified infection of urinary tract in pregnancy, third trimester: Secondary | ICD-10-CM

## 2015-11-29 DIAGNOSIS — O99019 Anemia complicating pregnancy, unspecified trimester: Secondary | ICD-10-CM

## 2015-11-29 NOTE — Progress Notes (Signed)
   PRENATAL VISIT NOTE  Subjective:  Molly MansonJewel Liggett is a 25 y.o. [id number] at 1899w3d being seen today for ongoing prenatal care.  She is currently monitored for the following issues for this low-risk pregnancy and has Supervision of low-risk pregnancy; Anemia affecting pregnancy, antepartum; and MS (multiple sclerosis) (HCC) on her problem list.  Patient reports irregular contractions and constant pelvic pressure.  Contractions: Irritability. Vag. Bleeding: None.  Movement: Present. Denies leaking of fluid.   The following portions of the patient's history were reviewed and updated as appropriate: allergies, current medications, past family history, past medical history, past social history, past surgical history and problem list. Problem list updated.  Objective:   Vitals:   11/29/15 0754  BP: 136/62  Pulse: (!) 102  Weight: 178 lb (80.7 kg)    Fetal Status: Fetal Heart Rate (bpm): 141 Fundal Height: 37 cm Movement: Present     General:  Alert, oriented and cooperative. Patient is in no acute distress.  Skin: Skin is warm and dry. No rash noted.   Cardiovascular: Normal heart rate noted  Respiratory: Normal respiratory effort, no problems with respiration noted  Abdomen: Soft, gravid, appropriate for gestational age. Pain/Pressure: Present     Pelvic:  Cervical exam performed Dilation: 4 Effacement (%): 50 Station: -2  Extremities: Normal range of motion.  Edema: None  Mental Status: Normal mood and affect. Normal behavior. Normal judgment and thought content.   Urinalysis:      Assessment and Plan:  Pregnancy: [id number] at 1699w3d  1. Encounter for supervision of low-risk pregnancy in third trimester  - Flu Vaccine QUAD 36+ mos IM (Fluarix, Quad PF) - Culture, beta strep (group b only) - GC/Chlamydia probe amp ([place] --Pt may go to MAU for labor evaluation today if contractions become regular, closer together.  Term labor symptoms and general obstetric precautions  including but not limited to vaginal bleeding, contractions, leaking of fluid and fetal movement were reviewed in detail with the patient. Please refer to After Visit Summary for other counseling recommendations.  Return in about 1 week (around 12/06/2015).  Hurshel PartyLisa A Leftwich-Kirby, CNM

## 2015-11-29 NOTE — Progress Notes (Signed)
BF discussed with patient

## 2015-11-30 ENCOUNTER — Inpatient Hospital Stay (HOSPITAL_COMMUNITY)
Admission: AD | Admit: 2015-11-30 | Discharge: 2015-11-30 | Disposition: A | Discharge status: Home / Self Care | Payer: Self-pay | Source: Ambulatory Visit | Attending: Obstetrics & Gynecology | Admitting: Obstetrics & Gynecology

## 2015-11-30 ENCOUNTER — Ambulatory Visit: Payer: Self-pay | Admitting: Certified Nurse Midwife

## 2015-11-30 DIAGNOSIS — Z3A37 37 weeks gestation of pregnancy: Secondary | ICD-10-CM | POA: Insufficient documentation

## 2015-11-30 DIAGNOSIS — O479 False labor, unspecified: Secondary | ICD-10-CM

## 2015-11-30 DIAGNOSIS — Z3493 Encounter for supervision of normal pregnancy, unspecified, third trimester: Secondary | ICD-10-CM | POA: Insufficient documentation

## 2015-11-30 LAB — GC/CHLAMYDIA PROBE AMP (~~LOC~~) NOT AT ARMC
CHLAMYDIA, DNA PROBE: NEGATIVE
NEISSERIA GONORRHEA: NEGATIVE

## 2015-11-30 NOTE — Progress Notes (Deleted)
Subjective

## 2015-11-30 NOTE — Discharge Instructions (Signed)
Keep your scheduled appt for prenatal care. Return to MAU at needed.

## 2015-11-30 NOTE — MAU Note (Signed)
UC's since yesterday. No leaking or bleeding. Feeling FM. Patient spitting into water bottle.

## 2015-11-30 NOTE — Progress Notes (Signed)
Molly Moreno is a 25 y.o. [id number] at [redacted]w[redacted]d being seen today for 3 hr GTT and labor check.  She is currently monitored for the following issues for this low-risk pregnancy and has Supervision of low-risk pregnancy; Anemia affecting pregnancy, antepartum; and MS (multiple sclerosis) (HCC) on her problem list.  Patient reports contractions since yesterday afternoon q5 min. Denies leaking of fluid. No VB. Good FM. SVE: 4/80/-1, vtx Pt uncomfortable, cervix more effaced from earlier exam, likely early labor, to MAU for continued evaluation   Donette Larry, CNM

## 2015-12-02 LAB — CULTURE, BETA STREP (GROUP B ONLY)

## 2015-12-06 ENCOUNTER — Ambulatory Visit (INDEPENDENT_AMBULATORY_CARE_PROVIDER_SITE_OTHER): Payer: Self-pay | Admitting: Family

## 2015-12-06 VITALS — BP 122/60 | HR 86 | Wt 176.0 lb

## 2015-12-06 DIAGNOSIS — R7302 Impaired glucose tolerance (oral): Secondary | ICD-10-CM

## 2015-12-06 DIAGNOSIS — Z23 Encounter for immunization: Secondary | ICD-10-CM

## 2015-12-06 DIAGNOSIS — O99353 Diseases of the nervous system complicating pregnancy, third trimester: Secondary | ICD-10-CM

## 2015-12-06 DIAGNOSIS — O99013 Anemia complicating pregnancy, third trimester: Secondary | ICD-10-CM

## 2015-12-06 DIAGNOSIS — O2343 Unspecified infection of urinary tract in pregnancy, third trimester: Secondary | ICD-10-CM

## 2015-12-06 DIAGNOSIS — D649 Anemia, unspecified: Secondary | ICD-10-CM

## 2015-12-06 DIAGNOSIS — R7309 Other abnormal glucose: Secondary | ICD-10-CM

## 2015-12-06 DIAGNOSIS — Z3493 Encounter for supervision of normal pregnancy, unspecified, third trimester: Secondary | ICD-10-CM

## 2015-12-06 DIAGNOSIS — G35 Multiple sclerosis: Secondary | ICD-10-CM

## 2015-12-06 DIAGNOSIS — Z3483 Encounter for supervision of other normal pregnancy, third trimester: Secondary | ICD-10-CM

## 2015-12-06 NOTE — Progress Notes (Signed)
Called pt and LM that her [place] is scheduled for 12/08/15 @ 1030 at [facility].  If she has any questions to please contact the office.

## 2015-12-06 NOTE — Progress Notes (Signed)
   PRENATAL VISIT NOTE  Subjective:  Molly Moreno is a 25 y.o. [id number] at [redacted]w[redacted]d being seen today for ongoing prenatal care.  She is currently monitored for the following issues for this low-risk pregnancy and has Supervision of low-risk pregnancy; Anemia affecting pregnancy, antepartum; MS (multiple sclerosis) (HCC); and Elevated glucose tolerance test on her problem list.  Patient reports occasional contractions.  Pt unable to complete 3 hr at last visit due to inability to get blood from vein.   Contractions: Irregular. Vag. Bleeding: Scant.  Movement: Present. Denies leaking of fluid.   The following portions of the patient's history were reviewed and updated as appropriate: allergies, current medications, past family history, past medical history, past social history, past surgical history and problem list. Problem list updated.  Objective:   Vitals:   12/06/15 0753  BP: 122/60  Pulse: 86  Weight: 176 lb (79.8 kg)    Fetal Status: Fetal Heart Rate (bpm): 134 Fundal Height: 38 cm Movement: Present  Presentation: Vertex  General:  Alert, oriented and cooperative. Patient is in no acute distress.  Skin: Skin is warm and dry. No rash noted.   Cardiovascular: Normal heart rate noted  Respiratory: Normal respiratory effort, no problems with respiration noted  Abdomen: Soft, gravid, appropriate for gestational age. Pain/Pressure: Present     Pelvic:  Cervical exam deferred Dilation: 4 Effacement (%): 50 Station: -2  Extremities: Normal range of motion.  Edema: None  Mental Status: Normal mood and affect. Normal behavior. Normal judgment and thought content.   Urinalysis:      Assessment and Plan:  Pregnancy: [id number] at [redacted]w[redacted]d  1. Need for Tdap vaccination - Tdap vaccine greater than or equal to 7yo IM  2. Encounter for supervision of low-risk pregnancy in third trimester - Reviewed GBS results (negative)  3. . Elevated glucose tolerance test - Consulted with Dr. Alysia Penna regarding  inability to get 3 hr > have patient return for another test (will do fingerstick) - Obtain anatomy ultrasound  Term labor symptoms and general obstetric precautions including but not limited to vaginal bleeding, contractions, leaking of fluid and fetal movement were reviewed in detail with the patient. Please refer to After Visit Summary for other counseling recommendations.  Return in about 1 week (around 12/13/2015). Tomorrow for labs only.  Molly Moreno Molly Moreno, CNM

## 2015-12-08 ENCOUNTER — Inpatient Hospital Stay (HOSPITAL_COMMUNITY)
Admission: AD | Admit: 2015-12-08 | Discharge: 2015-12-08 | Disposition: A | Discharge status: Home / Self Care | Payer: Self-pay | Source: Ambulatory Visit | Attending: Family Medicine | Admitting: Family Medicine

## 2015-12-08 ENCOUNTER — Ambulatory Visit (HOSPITAL_COMMUNITY)
Admission: RE | Admit: 2015-12-08 | Discharge: 2015-12-08 | Disposition: A | Discharge status: Home / Self Care | Payer: Self-pay | Source: Ambulatory Visit | Attending: Family | Admitting: Family

## 2015-12-08 ENCOUNTER — Encounter (HOSPITAL_COMMUNITY): Payer: Self-pay

## 2015-12-08 DIAGNOSIS — Z3A38 38 weeks gestation of pregnancy: Secondary | ICD-10-CM | POA: Insufficient documentation

## 2015-12-08 DIAGNOSIS — O24419 Gestational diabetes mellitus in pregnancy, unspecified control: Secondary | ICD-10-CM | POA: Insufficient documentation

## 2015-12-08 DIAGNOSIS — O99019 Anemia complicating pregnancy, unspecified trimester: Secondary | ICD-10-CM

## 2015-12-08 DIAGNOSIS — Z3493 Encounter for supervision of normal pregnancy, unspecified, third trimester: Secondary | ICD-10-CM | POA: Insufficient documentation

## 2015-12-08 DIAGNOSIS — O09213 Supervision of pregnancy with history of pre-term labor, third trimester: Secondary | ICD-10-CM | POA: Insufficient documentation

## 2015-12-08 DIAGNOSIS — G35 Multiple sclerosis: Secondary | ICD-10-CM

## 2015-12-08 DIAGNOSIS — Z3483 Encounter for supervision of other normal pregnancy, third trimester: Secondary | ICD-10-CM

## 2015-12-08 DIAGNOSIS — Z363 Encounter for antenatal screening for malformations: Secondary | ICD-10-CM | POA: Insufficient documentation

## 2015-12-08 LAB — AMNISURE RUPTURE OF MEMBRANE (ROM) NOT AT ARMC: AMNISURE: NEGATIVE

## 2015-12-08 NOTE — Discharge Instructions (Signed)
Braxton Hicks Contractions °Contractions of the uterus can occur throughout pregnancy. Contractions are not always a sign that you are in labor.  °WHAT ARE BRAXTON HICKS CONTRACTIONS?  °Contractions that occur before labor are called Braxton Hicks contractions, or false labor. Toward the end of pregnancy (32-34 weeks), these contractions can develop more often and may become more forceful. This is not true labor because these contractions do not result in opening (dilatation) and thinning of the cervix. They are sometimes difficult to tell apart from true labor because these contractions can be forceful and people have different pain tolerances. You should not feel embarrassed if you go to the hospital with false labor. Sometimes, the only way to tell if you are in true labor is for your health care provider to look for changes in the cervix. °If there are no prenatal problems or other health problems associated with the pregnancy, it is completely safe to be sent home with false labor and await the onset of true labor. °HOW CAN YOU TELL THE DIFFERENCE BETWEEN TRUE AND FALSE LABOR? °False Labor °· The contractions of false labor are usually shorter and not as hard as those of true labor.   °· The contractions are usually irregular.   °· The contractions are often felt in the front of the lower abdomen and in the groin.   °· The contractions may go away when you walk around or change positions while lying down.   °· The contractions get weaker and are shorter lasting as time goes on.   °· The contractions do not usually become progressively stronger, regular, and closer together as with true labor.   °True Labor °· Contractions in true labor last 30-70 seconds, become very regular, usually become more intense, and increase in frequency.   °· The contractions do not go away with walking.   °· The discomfort is usually felt in the top of the uterus and spreads to the lower abdomen and low back.   °· True labor can be  determined by your health care provider with an exam. This will show that the cervix is dilating and getting thinner.   °WHAT TO REMEMBER °· Keep up with your usual exercises and follow other instructions given by your health care provider.   °· Take medicines as directed by your health care provider.   °· Keep your regular prenatal appointments.   °· Eat and drink lightly if you think you are going into labor.   °· If Braxton Hicks contractions are making you uncomfortable:   °¨ Change your position from lying down or resting to walking, or from walking to resting.   °¨ Sit and rest in a tub of warm water.   °¨ Drink 2-3 glasses of water. Dehydration may cause these contractions.   °¨ Do slow and deep breathing several times an hour.   °WHEN SHOULD I SEEK IMMEDIATE MEDICAL CARE? °Seek immediate medical care if: °· Your contractions become stronger, more regular, and closer together.   °· You have fluid leaking or gushing from your vagina.   °· You have a fever.   °· You have vaginal bleeding.   °· You have continuous abdominal pain.   °· You have low back pain that you never had before.   °· You feel your baby's head pushing down and causing pelvic pressure.   °· Your baby is not moving as much as it used to.   °  °This information is not intended to replace advice given to you by your health care provider. Make sure you discuss any questions you have with your health care provider. °  °Document Released: 02/12/2005   Document Revised: 02/17/2013 Document Reviewed: 11/24/2012 °Elsevier Interactive Patient Education ©2016 [employer] Inc[employer] ° °

## 2015-12-08 NOTE — MAU Note (Signed)
Pt presents to MAU with complaints of contractions that started around 3pm. Reports leakage of fluid earlier today. Denies any vaginal bleeding. Reports baby is active

## 2015-12-09 ENCOUNTER — Encounter (HOSPITAL_COMMUNITY): Payer: Self-pay | Admitting: *Deleted

## 2015-12-09 ENCOUNTER — Inpatient Hospital Stay (HOSPITAL_COMMUNITY)
Admission: AD | Admit: 2015-12-09 | Discharge: 2015-12-11 | DRG: 775 | Disposition: A | Discharge status: Home / Self Care | Payer: Self-pay | Source: Ambulatory Visit | Attending: Obstetrics & Gynecology | Admitting: Obstetrics & Gynecology

## 2015-12-09 DIAGNOSIS — O9902 Anemia complicating childbirth: Secondary | ICD-10-CM | POA: Diagnosis present

## 2015-12-09 DIAGNOSIS — Z3493 Encounter for supervision of normal pregnancy, unspecified, third trimester: Secondary | ICD-10-CM

## 2015-12-09 DIAGNOSIS — D649 Anemia, unspecified: Secondary | ICD-10-CM | POA: Diagnosis present

## 2015-12-09 DIAGNOSIS — O4202 Full-term premature rupture of membranes, onset of labor within 24 hours of rupture: Principal | ICD-10-CM | POA: Diagnosis present

## 2015-12-09 DIAGNOSIS — Z3A38 38 weeks gestation of pregnancy: Secondary | ICD-10-CM

## 2015-12-09 DIAGNOSIS — G35 Multiple sclerosis: Secondary | ICD-10-CM | POA: Diagnosis present

## 2015-12-09 DIAGNOSIS — O99019 Anemia complicating pregnancy, unspecified trimester: Secondary | ICD-10-CM

## 2015-12-09 DIAGNOSIS — IMO0002 Reserved for concepts with insufficient information to code with codable children: Secondary | ICD-10-CM | POA: Diagnosis present

## 2015-12-09 MED ORDER — ONDANSETRON HCL 4 MG/2ML IJ SOLN: 4.0000 mg | Freq: Four times a day (QID) | INTRAMUSCULAR | Status: DC | PRN

## 2015-12-09 MED ORDER — LACTATED RINGERS IV SOLN
500.0000 mL | INTRAVENOUS | Status: DC | PRN
  Administered 2015-12-10: 500 mL via INTRAVENOUS

## 2015-12-09 MED ORDER — OXYCODONE-ACETAMINOPHEN 5-325 MG PO TABS: 2.0000 | ORAL_TABLET | ORAL | Status: DC | PRN

## 2015-12-09 MED ORDER — OXYTOCIN 10 UNIT/ML IJ SOLN
INTRAMUSCULAR | Status: DC
Start: 2015-12-09 — End: 2015-12-10
  Filled 2015-12-09: qty 1

## 2015-12-09 MED ORDER — FLEET ENEMA 7-19 GM/118ML RE ENEM: 1.0000 | ENEMA | RECTAL | Status: DC | PRN

## 2015-12-09 MED ORDER — OXYTOCIN BOLUS FROM INFUSION: 500.0000 mL | Freq: Once | INTRAVENOUS | Status: DC

## 2015-12-09 MED ORDER — ACETAMINOPHEN 325 MG PO TABS
650.0000 mg | ORAL_TABLET | ORAL | Status: DC | PRN
  Filled 2015-12-09: qty 2

## 2015-12-09 MED ORDER — SOD CITRATE-CITRIC ACID 500-334 MG/5ML PO SOLN: 30.0000 mL | ORAL | Status: DC | PRN

## 2015-12-09 MED ORDER — LACTATED RINGERS IV SOLN
INTRAVENOUS | Status: DC
  Administered 2015-12-10: 01:00:00 via INTRAVENOUS

## 2015-12-09 MED ORDER — OXYCODONE-ACETAMINOPHEN 5-325 MG PO TABS
1.0000 | ORAL_TABLET | ORAL | Status: DC | PRN
  Administered 2015-12-10 – 2015-12-11 (×4): 1 via ORAL
  Filled 2015-12-09 (×4): qty 1

## 2015-12-09 MED ORDER — OXYTOCIN 40 UNITS IN LACTATED RINGERS INFUSION - SIMPLE MED
2.5000 [IU]/h | INTRAVENOUS | Status: DC
  Administered 2015-12-10: 2.5 [IU]/h via INTRAVENOUS
  Filled 2015-12-09: qty 1000

## 2015-12-09 MED ORDER — LIDOCAINE HCL (PF) 1 % IJ SOLN
30.0000 mL | INTRAMUSCULAR | Status: DC | PRN
  Filled 2015-12-09: qty 30

## 2015-12-09 NOTE — MAU Note (Signed)
Leaking fld since 2115. Clear fld. Ctxs started after leaking started. 3cm last sve.

## 2015-12-09 NOTE — Progress Notes (Signed)
To BS via w/c from Triage for further eval of labor.

## 2015-12-09 NOTE — H&P (Signed)
LABOR AND DELIVERY ADMISSION HISTORY AND PHYSICAL NOTE  Molly Moreno is a 25 y.o. female [phone] with IUP at [redacted]w[redacted]d by LMP presenting for SROM at 2115. She started having strong, regular contractions after her water broke.  She reports positive fetal movement. She denies vaginal bleeding.  Prenatal History/Complications: - Patient has MS. She has not been on medications this pregnancy. - Elevated 3rd trimester 1 hr GTT at 150, but she was unable to complete 3 hr GTT due to inability to get blood from vein - Maternal anemia with a Hgb of 9.5 - Hx right salpingectomy in 2012 due to ruptured ectopic. - Transferred care from [facility] Have requested records x 2, but haven't obtained them.  Past Medical History: Past Medical History:  Diagnosis Date  . Asthma   . Multiple sclerosis (HCC)     Past Surgical History: Past Surgical History:  Procedure Laterality Date  . salphinectomy Right 2012   ruptured ectopic    Obstetrical History: OB History    Gravida Para Term Preterm AB Living   5 2 1 1 2 2    SAB TAB Ectopic Multiple Live Births     1 1   2       Social History: Social History   Social History  . Marital status: Single    Spouse name: N/A  . Number of children: N/A  . Years of education: N/A   Social History Main Topics  . Smoking status: Never Smoker  . Smokeless tobacco: Never Used  . Alcohol use No  . Drug use: No  . Sexual activity: Not Currently    Birth control/ protection: None   Other Topics Concern  . None   Social History Narrative  . None    Family History: Family History  Problem Relation Age of Onset  . Cancer Maternal Grandmother     colon  . Cancer Maternal Grandfather     colon    Allergies: Allergies  Allergen Reactions  . Shellfish Allergy Anaphylaxis    Prescriptions Prior to Admission  Medication Sig Dispense Refill Last Dose  . acetaminophen (TYLENOL) 500 MG tablet Take 1,000 mg by mouth every 6 (six) hours as  needed for mild pain, moderate pain or headache.   12/08/2015 at 2230  . calcium carbonate (TUMS - DOSED IN MG ELEMENTAL CALCIUM) 500 MG chewable tablet Chew 2 tablets by mouth as needed for indigestion or heartburn.    12/08/2015 at Unknown time  . Prenatal Vit-Fe Fumarate-FA (PRENATAL MULTIVITAMIN) TABS tablet Take 1 tablet by mouth daily.    12/08/2015 at Unknown time     Review of Systems   All systems reviewed and negative except as stated in HPI  Blood pressure 128/78, pulse 79, temperature 98.8 F (37.1 C), temperature source Oral, resp. rate 18, height 4' 11.02" (1.499 m), weight 80.7 kg (178 lb), last menstrual period 03/12/2015. General appearance: alert, cooperative and moderate distress Lungs: clear to auscultation bilaterally Heart: regular rate and rhythm Abdomen: soft, non-tender; bowel sounds normal Extremities: No calf swelling or tenderness Presentation: cephalic by exam Fetal monitoring: Baseline 120, moderate variability, accelerations present, no decelerations Uterine activity: moderate contractions every 2-4 minutes Dilation: Lip/rim Effacement (%): 100 Station: +1 Exam by:: a. harper, RNc-OB   Prenatal labs: ABO, Rh:  Unknown Antibody:  Unknown Rubella: Unknown RPR: NON REAC (09/13 0939)  HBsAg:    HIV: NONREACTIVE (09/13 0939)  GBS:    1 hr Glucola: 150, unable to complete 3 hr GTT due to  inability to get blood from vein Genetic screening:  Declined Anatomy US: Normal  Prenatal Transfer Tool  Maternal Diabetes: Failed 1hr GTT, unable to complete 3hr GTT Genetic Screening: Declined Maternal Ultrasounds/Referrals: Normal Fetal Ultrasounds or other Referrals:  None Maternal Substance Abuse:  No Significant Maternal Medications:  None Significant Maternal Lab Results: Lab values include: Group B Strep negative  No results found for this or any previous visit (from the past 24 hour(s)).  Patient Active Problem List   Diagnosis Date Noted  .  Rupture of membranes with clear amniotic fluid 12/09/2015  . Elevated glucose tolerance test 12/06/2015  . Supervision of low-risk pregnancy 11/09/2015  . Anemia affecting pregnancy, antepartum 11/09/2015  . MS (multiple sclerosis) (HCC) 11/09/2015    Assessment: Molly Moreno is a 25 y.o. [id number] at 3561w6d here for SROM at 2115.  #Labor: SROM (clear) at 2115, Currently with anterior lip. #Pain: Not enough time for epidural. Will try nitrous. #FWB: Category I #ID: GBS neg #MOF: Breast #MOC: Undecided #Circ: Yes- inpatient circ  Jinny BlossomKaty D Mayo 12/09/2015, 10:50 PM   I have seen and examined this patient and I agree with the above. Cam HaiSHAW, KIMBERLY CNM 9:54 AM 12/10/2015

## 2015-12-10 ENCOUNTER — Encounter (HOSPITAL_COMMUNITY): Payer: Self-pay | Admitting: Anesthesiology

## 2015-12-10 ENCOUNTER — Inpatient Hospital Stay (HOSPITAL_COMMUNITY): Payer: Self-pay | Admitting: Anesthesiology

## 2015-12-10 DIAGNOSIS — Z3A39 39 weeks gestation of pregnancy: Secondary | ICD-10-CM

## 2015-12-10 LAB — TYPE AND SCREEN
ABO/RH(D): AB POS
ANTIBODY SCREEN: NEGATIVE

## 2015-12-10 LAB — CBC
HCT: 29.4 % — ABNORMAL LOW (ref 36.0–46.0)
HEMOGLOBIN: 9.8 g/dL — AB (ref 12.0–15.0)
MCH: 27.2 pg (ref 26.0–34.0)
MCHC: 33.3 g/dL (ref 30.0–36.0)
MCV: 81.7 fL (ref 78.0–100.0)
Platelets: 341 10*3/uL (ref 150–400)
RBC: 3.6 MIL/uL — AB (ref 3.87–5.11)
RDW: 15.3 % (ref 11.5–15.5)
WBC: 12.8 10*3/uL — ABNORMAL HIGH (ref 4.0–10.5)

## 2015-12-10 LAB — DIFFERENTIAL
Basophils Absolute: 0 10*3/uL (ref 0.0–0.1)
Basophils Relative: 0 %
EOS ABS: 0.1 10*3/uL (ref 0.0–0.7)
EOS PCT: 1 %
Lymphocytes Relative: 46 %
Lymphs Abs: 5.9 10*3/uL — ABNORMAL HIGH (ref 0.7–4.0)
Monocytes Absolute: 0.7 10*3/uL (ref 0.1–1.0)
Monocytes Relative: 6 %
NEUTROS PCT: 47 %
Neutro Abs: 6.1 10*3/uL (ref 1.7–7.7)

## 2015-12-10 LAB — ABO/RH: ABO/RH(D): AB POS

## 2015-12-10 LAB — RPR: RPR: NONREACTIVE

## 2015-12-10 LAB — HEPATITIS B SURFACE ANTIGEN: HEP B S AG: NEGATIVE

## 2015-12-10 MED ORDER — ZOLPIDEM TARTRATE 5 MG PO TABS: 5.0000 mg | ORAL_TABLET | Freq: Every evening | ORAL | Status: DC | PRN

## 2015-12-10 MED ORDER — FENTANYL 2.5 MCG/ML BUPIVACAINE 1/10 % EPIDURAL INFUSION (WH - ANES)
11.0000 mL/h | INTRAMUSCULAR | Status: DC | PRN
  Administered 2015-12-10: 11 mL/h via EPIDURAL

## 2015-12-10 MED ORDER — PHENYLEPHRINE 40 MCG/ML (10ML) SYRINGE FOR IV PUSH (FOR BLOOD PRESSURE SUPPORT)
PREFILLED_SYRINGE | INTRAVENOUS | Status: AC
  Filled 2015-12-10: qty 20

## 2015-12-10 MED ORDER — WITCH HAZEL-GLYCERIN EX PADS: 1.0000 "application " | MEDICATED_PAD | CUTANEOUS | Status: DC | PRN

## 2015-12-10 MED ORDER — PRENATAL MULTIVITAMIN CH
1.0000 | ORAL_TABLET | Freq: Every day | ORAL | Status: DC
  Administered 2015-12-10 – 2015-12-11 (×2): 1 via ORAL
  Filled 2015-12-10 (×2): qty 1

## 2015-12-10 MED ORDER — DIPHENHYDRAMINE HCL 50 MG/ML IJ SOLN: 12.5000 mg | INTRAMUSCULAR | Status: DC | PRN

## 2015-12-10 MED ORDER — SIMETHICONE 80 MG PO CHEW: 80.0000 mg | CHEWABLE_TABLET | ORAL | Status: DC | PRN

## 2015-12-10 MED ORDER — DIPHENHYDRAMINE HCL 25 MG PO CAPS: 25.0000 mg | ORAL_CAPSULE | Freq: Four times a day (QID) | ORAL | Status: DC | PRN

## 2015-12-10 MED ORDER — ONDANSETRON HCL 4 MG/2ML IJ SOLN: 4.0000 mg | INTRAMUSCULAR | Status: DC | PRN

## 2015-12-10 MED ORDER — IBUPROFEN 600 MG PO TABS
600.0000 mg | ORAL_TABLET | Freq: Four times a day (QID) | ORAL | Status: DC
  Administered 2015-12-10 – 2015-12-11 (×6): 600 mg via ORAL
  Filled 2015-12-10 (×6): qty 1

## 2015-12-10 MED ORDER — COCONUT OIL OIL: 1.0000 "application " | TOPICAL_OIL | Status: DC | PRN

## 2015-12-10 MED ORDER — DIBUCAINE 1 % RE OINT: 1.0000 "application " | TOPICAL_OINTMENT | RECTAL | Status: DC | PRN

## 2015-12-10 MED ORDER — FENTANYL 2.5 MCG/ML BUPIVACAINE 1/10 % EPIDURAL INFUSION (WH - ANES)
INTRAMUSCULAR | Status: AC
  Administered 2015-12-10: 01:00:00
  Filled 2015-12-10: qty 125

## 2015-12-10 MED ORDER — SENNOSIDES-DOCUSATE SODIUM 8.6-50 MG PO TABS
2.0000 | ORAL_TABLET | ORAL | Status: DC
  Administered 2015-12-10: 2 via ORAL
  Filled 2015-12-10: qty 2

## 2015-12-10 MED ORDER — BENZOCAINE-MENTHOL 20-0.5 % EX AERO: 1.0000 "application " | INHALATION_SPRAY | CUTANEOUS | Status: DC | PRN

## 2015-12-10 MED ORDER — TETANUS-DIPHTH-ACELL PERTUSSIS 5-2.5-18.5 LF-MCG/0.5 IM SUSP: 0.5000 mL | Freq: Once | INTRAMUSCULAR | Status: DC

## 2015-12-10 MED ORDER — PHENYLEPHRINE 40 MCG/ML (10ML) SYRINGE FOR IV PUSH (FOR BLOOD PRESSURE SUPPORT)
80.0000 ug | PREFILLED_SYRINGE | INTRAVENOUS | Status: DC | PRN
  Filled 2015-12-10: qty 5

## 2015-12-10 MED ORDER — LIDOCAINE HCL (PF) 1 % IJ SOLN
INTRAMUSCULAR | Status: DC | PRN
  Administered 2015-12-10 (×2): 4 mL via EPIDURAL

## 2015-12-10 MED ORDER — ACETAMINOPHEN 325 MG PO TABS
650.0000 mg | ORAL_TABLET | ORAL | Status: DC | PRN
  Administered 2015-12-10: 650 mg via ORAL

## 2015-12-10 MED ORDER — ONDANSETRON HCL 4 MG PO TABS: 4.0000 mg | ORAL_TABLET | ORAL | Status: DC | PRN

## 2015-12-10 NOTE — Anesthesia Procedure Notes (Signed)
Epidural Patient location during procedure: OB Start time: 12/10/2015 12:40 AM  Staffing Anesthesiologist: Mal Amabile Performed: anesthesiologist   Preanesthetic Checklist Completed: patient identified, site marked, surgical consent, pre-op evaluation, timeout performed, IV checked, risks and benefits discussed and monitors and equipment checked  Epidural Patient position: sitting Prep: site prepped and draped and DuraPrep Patient monitoring: continuous pulse ox and blood pressure Approach: midline Location: L4-L5 Injection technique: LOR air  Needle:  Needle type: Tuohy  Needle gauge: 17 G Needle length: 9 cm and 9 Needle insertion depth: 7 cm Catheter type: closed end flexible Catheter size: 19 Gauge Catheter at skin depth: 12 cm Test dose: negative and Other  Assessment Events: blood not aspirated, injection not painful, no injection resistance, negative IV test and no paresthesia  Additional Notes Patient identified. Risks and benefits discussed including failed block, incomplete  Pain control, post dural puncture headache, nerve damage, paralysis, blood pressure Changes, nausea, vomiting, reactions to medications-both toxic and allergic and post Partum back pain. All questions were answered. Patient expressed understanding and wished to proceed. Sterile technique was used throughout procedure. Epidural site was Dressed with sterile barrier dressing. No paresthesias, signs of intravascular injection Or signs of intrathecal spread were encountered.  Patient was more comfortable after the epidural was dosed. Please see RN's note for documentation of vital signs and FHR which are stable.

## 2015-12-10 NOTE — Anesthesia Preprocedure Evaluation (Addendum)
Anesthesia Evaluation  Patient identified by MRN, date of birth, ID band Patient awake    Reviewed: Allergy & Precautions, NPO status , Patient's Chart, lab work & pertinent test results  Airway Mallampati: III  TM Distance: >3 FB Neck ROM: Full    Dental no notable dental hx. (+) Teeth Intact   Pulmonary asthma ,    Pulmonary exam normal breath sounds clear to auscultation       Cardiovascular negative cardio ROS Normal cardiovascular exam Rhythm:Regular Rate:Normal     Neuro/Psych MS negative psych ROS   GI/Hepatic Neg liver ROS, GERD  Medicated and Controlled,  Endo/Other  Obesity  Renal/GU negative Renal ROS  negative genitourinary   Musculoskeletal negative musculoskeletal ROS (+)   Abdominal (+) + obese,   Peds  Hematology  (+) anemia ,   Anesthesia Other Findings   Reproductive/Obstetrics (+) Pregnancy                             Lab Results  Component Value Date   WBC 12.8 (H) 12/10/2015   HGB 9.8 (L) 12/10/2015   HCT 29.4 (L) 12/10/2015   MCV 81.7 12/10/2015   PLT 341 12/10/2015    Anesthesia Physical Anesthesia Plan  ASA: II  Anesthesia Plan: Epidural   Post-op Pain Management:    Induction:   Airway Management Planned: Natural Airway  Additional Equipment:   Intra-op Plan:   Post-operative Plan:   Informed Consent: I have reviewed the patients History and Physical, chart, labs and discussed the procedure including the risks, benefits and alternatives for the proposed anesthesia with the patient or authorized representative who has indicated his/her understanding and acceptance.   Dental advisory given  Plan Discussed with: Anesthesiologist  Anesthesia Plan Comments:         Anesthesia Quick Evaluation

## 2015-12-10 NOTE — Lactation Note (Signed)
This note was copied from a baby's chart. Lactation Consultation Note  Patient Name: Molly Moreno [id number] Date: 12/10/2015 Reason for consult: Follow-up assessment   Mom called requesting feeding assessment. Infant was awake and alert in crib and mom was getting settled. Mom hand expressed colostrum and then latched infant to right breast in the cradle hold. Discussed with mom using the cross cradle hold to latch infant. Infant latched easily and mom c/o mild pain/tenderness, showed mom how to flange upper lip and had mom lean back and pull infant to her, Mom reported the latch felt better. Enc mom to compress/massage breast with feeding. Infant was noted to have intermittent swallows that increased with breast compression. Mom experiencing uterine cramping with feeding, RN gave mom pain medication. Mom reports cramping was improved as feeding progressed. Follow up tomorrow and prn.    Maternal Data Formula Feeding for Exclusion: No Does the patient have breastfeeding experience prior to this delivery?: Yes  Feeding Feeding Type: Breast Fed Length of feed: 15 min (still feeding when I left the room)  LATCH Score/Interventions Latch: Grasps breast easily, tongue down, lips flanged, rhythmical sucking. Intervention(s): Adjust position;Assist with latch;Breast massage;Breast compression  Audible Swallowing: A few with stimulation  Type of Nipple: Everted at rest and after stimulation  Comfort (Breast/Nipple): Filling, red/small blisters or bruises, mild/mod discomfort  Problem noted: Mild/Moderate discomfort Interventions (Mild/moderate discomfort):  (flanged lips)  Hold (Positioning): Assistance needed to correctly position infant at breast and maintain latch. Intervention(s): Breastfeeding basics reviewed;Support Pillows;Position options;Skin to skin  LATCH Score: 7  Lactation Tools Discussed/Used WIC Program: No   Consult Status Consult Status: Follow-up Date:  12/11/15 Follow-up type: In-patient    Silas Flood [name] 12/10/2015, 8:15 PM

## 2015-12-10 NOTE — Anesthesia Postprocedure Evaluation (Signed)
Anesthesia Post Note  Patient: Molly Moreno  Procedure(s) Performed: * No procedures listed *  Patient location during evaluation: Mother Baby Anesthesia Type: Epidural Level of consciousness: awake and alert Pain management: satisfactory to patient Vital Signs Assessment: post-procedure vital signs reviewed and stable Respiratory status: respiratory function stable Cardiovascular status: stable Postop Assessment: no headache, no backache, epidural receding, patient able to bend at knees, no signs of nausea or vomiting and adequate PO intake Anesthetic complications: no     Last Vitals:  Vitals:   12/10/15 0615 12/10/15 1025  BP: (!) 117/59 107/61  Pulse: 98 76  Resp: 20 16  Temp: 36.7 C 36.7 C    Last Pain:  Vitals:   12/10/15 1025  TempSrc: Axillary  PainSc:    Pain Goal: Patients Stated Pain Goal: 0 (12/09/15 2234)               Mallika Sanmiguel

## 2015-12-10 NOTE — Lactation Note (Signed)
This note was copied from a baby's chart. Lactation Consultation Note  Patient Name: Molly Moreno AVWUJ'W Date: 12/10/2015 Reason for consult: Initial assessment   Initial consult with Exp BF mom of 13 hour old infant. Infant asleep in moms arms. She reports she fed last at 1510 for almost 20 minutes.Infant with 3 BF for 15 minutes, 3 attempts, 1 void and 0 stools since birth. LATCH Scores 8 by bedside RN's. Infant weight 6 lb 14.2 oz.  Mom asked if she could bring her pump and start pumping, she voiced wanting to see what infant is getting. Mom reports she feels BF is going well but voiced she is worried infant is not getting enough. Discussed with mom to feed infant 8-12 x in 24 hours. Discussed with mom that if she would like to start pumping she can but that infant is the best pump and that the pump may not give her measurable volumes at this time. Enc mom to hand express, massage/compress breast with feeding and to feed infant at first feeding cues, mom voiced understanding. Mom says she may call out later to ask for feeding assistance, encouraged her to do so.   BF Resources handout and LC Brochure given, mom informed of IP/OP Services, BF Support Groups and LC phone #. Mom reports she has a [employer] DEBP for home use. Enc mom to call out for feeding assistance as needed.      Maternal Data Formula Feeding for Exclusion: No Does the patient have breastfeeding experience prior to this delivery?: Yes  Feeding Length of feed: 15 min  LATCH Score/Interventions                      Lactation Tools Discussed/Used WIC Program: No   Consult Status Consult Status: Follow-up Date: 12/10/15 Follow-up type: In-patient    Molly Moreno 12/10/2015, 4:45 PM

## 2015-12-11 LAB — RUBELLA SCREEN: Rubella: 1.21 index (ref >0.99)

## 2015-12-11 MED ORDER — IBUPROFEN 600 MG PO TABS: 600.0000 mg | ORAL_TABLET | Freq: Four times a day (QID) | ORAL | 0 refills | Status: AC

## 2015-12-11 NOTE — Clinical Social Work Maternal (Signed)
CLINICAL SOCIAL WORK MATERNAL/CHILD NOTE  Patient Details  Name: Molly Moreno MRN: [id number] Date of Birth: 12/10/2016  Date:  12/11/2015  Clinical Social Worker Initiating Note:  Chanelle Ward, MSW, LCSW-A  Date/ Time Initiated:  12/11/15/1321         Child's Name:  Molly Moreno    Legal Guardian:  Mother   Need for Interpreter:  None   Date of Referral:  12/10/15     Reason for Referral:  Late or No Prenatal Care    Referral Source:  Physician   Address:  [address] [place], [place] [address]   Phone number:  [phone]   Household Members: Self, Minor Children   Natural Supports (not living in the home): Parent, Extended Family   Professional Supports:None   Employment:    Type of Work:     Education:  9 to 11 years   Financial Resources:Self-Pay    Other Resources:     Cultural/Religious Considerations Which May Impact Care: None reported.   Strengths: Ability to meet basic needs , Compliance with medical plan , Pediatrician chosen    Risk Factors/Current Problems: None   Cognitive State: Alert , Goal Oriented , Insightful    Mood/Affect: Calm , Comfortable    CSW Assessment:CSW met with MOB at bedside to complete assessment. This writer explained reasoning for visit being due to her LPNC. This writer informed MOB of the hospitals policy and procedure regarding LPNC and UDS/cord blood. MOB verbalized understanding and noted that was fine. MOB did not state a reason for her LPNC; however, said she has no concerns as she does not use substance and never has. MOB verbalized no further needs. CSW will continue to follow UDS and cord blood test results.   CSW Plan/Description: Psychosocial Support and Ongoing Assessment of Needs   Chanelle Ward, MSW, LCSW-A Clinical Social Worker  [facility]  Office: [phone]  

## 2015-12-11 NOTE — Lactation Note (Signed)
This note was copied from a baby's chart. Lactation Consultation Note  Patient Name: Molly Moreno [id number] Date: 12/11/2015 Reason for consult: Follow-up assessment;Other (Comment);Infant weight loss (2% weight loss, post circ , last feeding 1135 ) Baby is 35 hours old and has been consistent. With breast feeding . Per mom breast feeding well , breast are getting fuller, heavier today. Also baby was circ'd and has been sleepy since he returned. Per mom I have just let him sleep. Per mom aware to try in alittle bit .  Sore nipple and engorgement prevention and tx reviewed. Per mom has DEBP at home.  LC reviewed basics - before latching - breast massage, hand express, , latch with firm support and breast compressions until swallows and then intermittent.  Discussed nutritive vs non - nutritive feeding patterns.  Mother informed of post-discharge support and given phone number to the lactation department, including services for phone call assistance; out-patient appointments; and breastfeeding support group. List of other breastfeeding resources in the community given in the handout. Encouraged mother to call for problems or concerns related to breastfeeding.   Maternal Data Has patient been taught Hand Expression?: Yes (mom experince BF , and comfortable with hand expression )  Feeding Length of feed: 10 min  LATCH Score/Interventions                Intervention(s): Breastfeeding basics reviewed     Lactation Tools Discussed/Used Pump Review: Milk Storage Initiated by:: MAI  Date initiated:: 12/11/15   Consult Status Consult Status: Complete Date: 12/11/15    Kathrin Greathouse 12/11/2015, 2:39 PM

## 2015-12-11 NOTE — Progress Notes (Signed)
Post discharge chart review completed.  

## 2015-12-11 NOTE — Discharge Summary (Signed)
OB Discharge Summary  Patient Name: Molly MansonJewel Manz DOB: 1990-04-20 MRN: [id number]  Date of admission: 12/09/2015 Delivering MD: Willadean CarolMAYO, KATY DODD   Date of discharge: 12/11/2015  Admitting diagnosis: 38w water broke, ctx Intrauterine pregnancy: 4526w0d     Secondary diagnosis:Active Problems:   Rupture of membranes with clear amniotic fluid  Additional problems:none     Discharge diagnosis: Term Pregnancy Delivered                                                                     Post partum procedures:n/a  Augmentation: Pitocin  Complications: None  Hospital course:  Onset of Labor With Vaginal Delivery     25 y.o. yo [id number] at 5626w0d was admitted in Active Labor on 12/09/2015. Patient had an uncomplicated labor course as follows:  Membrane Rupture Time/Date: 9:15 PM ,12/09/2015   Intrapartum Procedures: Episiotomy: None [1]                                         Lacerations:  None [1];Periurethral [8]  Patient had a delivery of a Viable infant. 12/10/2015  Information for the patient's newborn:  Woodroe ModeHoover, Boy Celsa [[id number]][[id number]]  Delivery Method: Vaginal, Spontaneous Delivery (Filed from Delivery Summary)    Pateint had an uncomplicated postpartum course.  She is ambulating, tolerating a regular diet, passing flatus, and urinating well. Patient is discharged home in stable condition on 12/11/15.    Physical exam Vitals:   12/10/15 0615 12/10/15 1025 12/10/15 1753 12/11/15 0600  BP: (!) 117/59 107/61 112/62 128/66  Pulse: 98 76 78 75  Resp: 20 16 18 18   Temp: 98 F (36.7 C) 98 F (36.7 C) 98.4 F (36.9 C) 97.7 F (36.5 C)  TempSrc: Oral Axillary Oral Oral  SpO2:  99% 99%   Weight:      Height:       General: alert, cooperative and no distress Lochia: appropriate Uterine Fundus: firm Incision: N/A DVT Evaluation: No evidence of DVT seen on physical exam. Labs: Lab Results  Component Value Date   WBC 12.8 (H) 12/10/2015   HGB 9.8 (L) 12/10/2015   HCT  29.4 (L) 12/10/2015   MCV 81.7 12/10/2015   PLT 341 12/10/2015   No flowsheet data found.  Discharge instruction: per After Visit Summary and "Baby and Me Booklet".  After Visit Meds:    Medication List    STOP taking these medications   acetaminophen 500 MG tablet Commonly known as:  TYLENOL     TAKE these medications   calcium carbonate 500 MG chewable tablet Commonly known as:  TUMS - dosed in mg elemental calcium Chew 2 tablets by mouth as needed for indigestion or heartburn.   ibuprofen 600 MG tablet Commonly known as:  ADVIL,MOTRIN Take 1 tablet (600 mg total) by mouth every 6 (six) hours.   prenatal multivitamin Tabs tablet Take 1 tablet by mouth daily.       Diet: routine diet  Activity: Advance as tolerated. Pelvic rest for 6 weeks.   Outpatient follow up:6 weeks Follow up Appt:Future Appointments Date Time Provider Department Center  12/13/2015 7:40 AM Wilmer FloorLisa A  Cherie Ouch [facility]  12/19/2015 7:40 AM Hermina Staggers, MD [facility]   Follow up visit: No Follow-up on file.  Postpartum contraception: Undecided  Newborn Data: Live born female  Birth Weight: 6 lb 14.2 oz (3124 g) APGAR: 8, 9  Baby Feeding: Breast Disposition:home with mother   12/11/2015 Wyvonnia Dusky, CNM

## 2015-12-13 ENCOUNTER — Encounter: Payer: Self-pay | Admitting: Advanced Practice Midwife

## 2015-12-19 ENCOUNTER — Encounter: Payer: Self-pay | Admitting: Obstetrics and Gynecology

## 2016-01-18 ENCOUNTER — Ambulatory Visit: Payer: Self-pay | Admitting: Family Medicine

## 2017-02-20 IMAGING — US US MFM OB COMP +14 WKS
1 series · 14 of 28 positions shown · non-contrast
Comparison: none

[Series 1: us mfm ob comp +14 wks · 76 acquisitions, 14 frames shown]
[im 3/76]
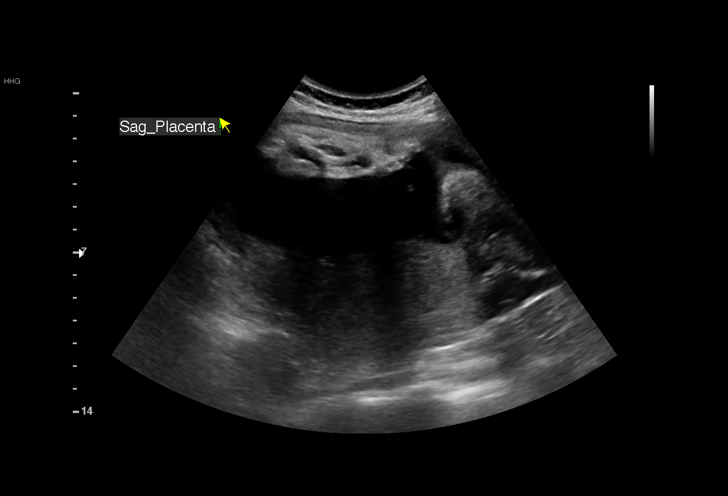
[im 9/76]
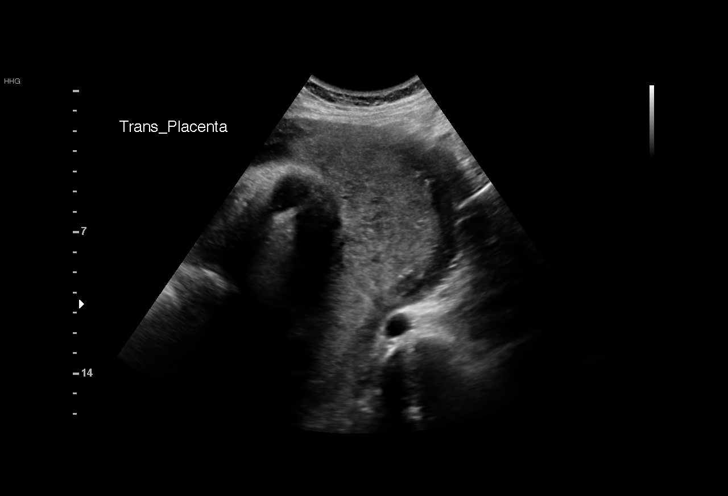
[im 14/76]
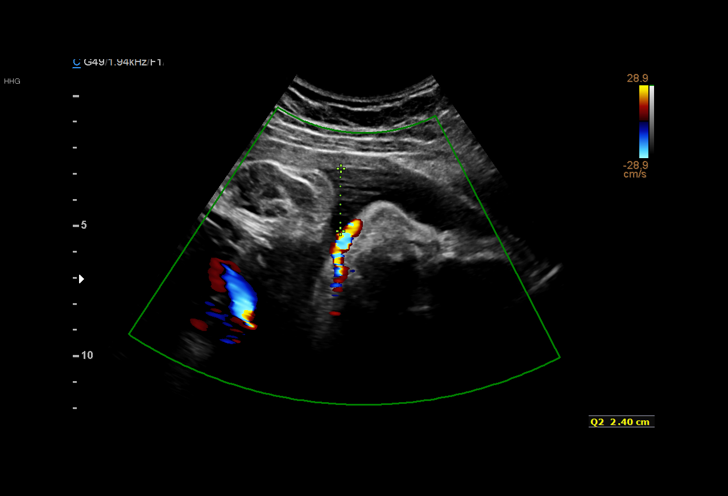
[im 20/76]
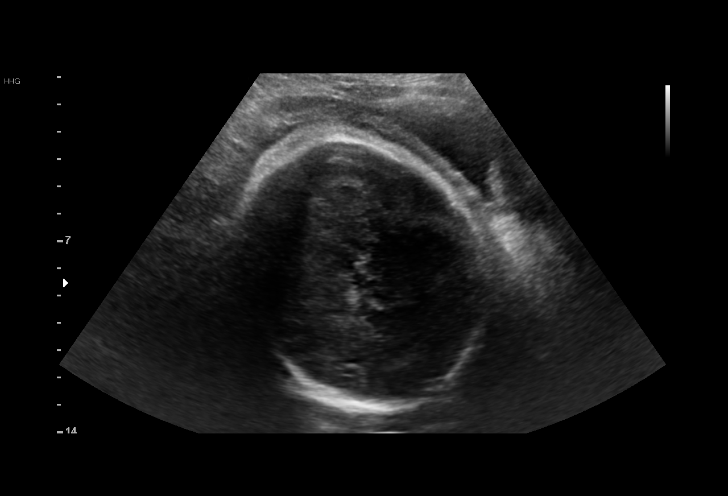
[im 26/76]
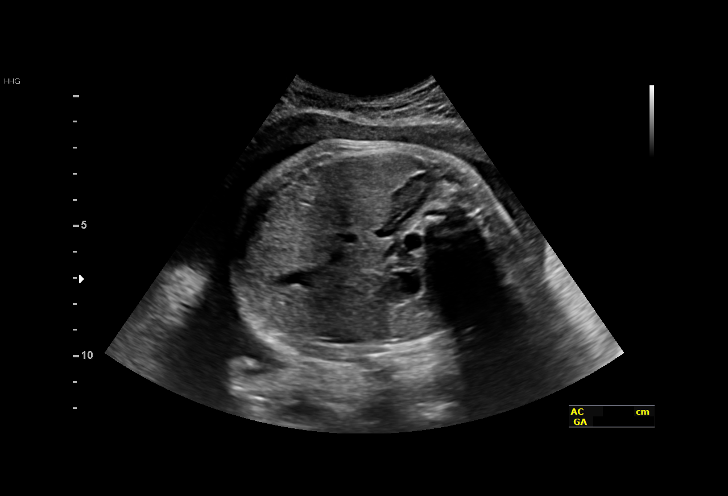
[im 31/76]
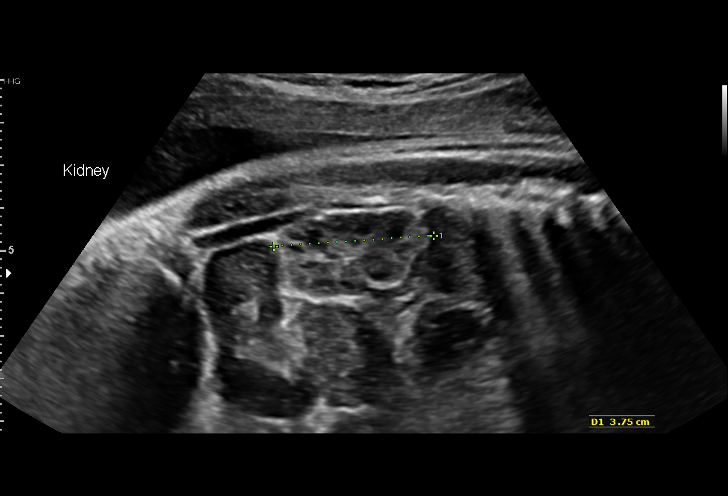
[im 37/76]
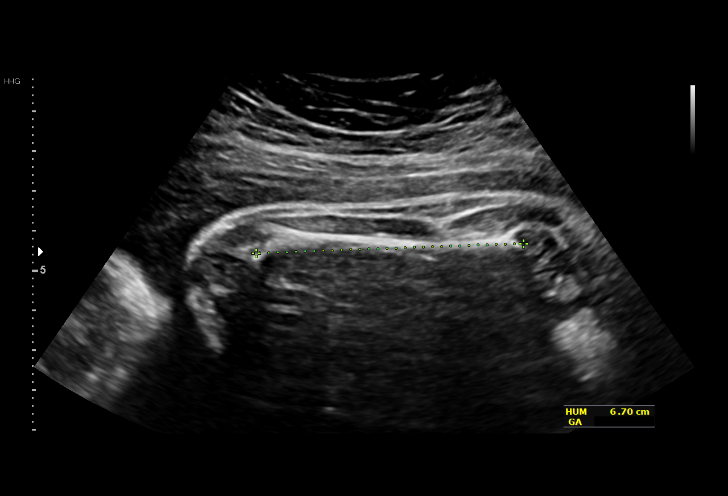
[im 42/76]
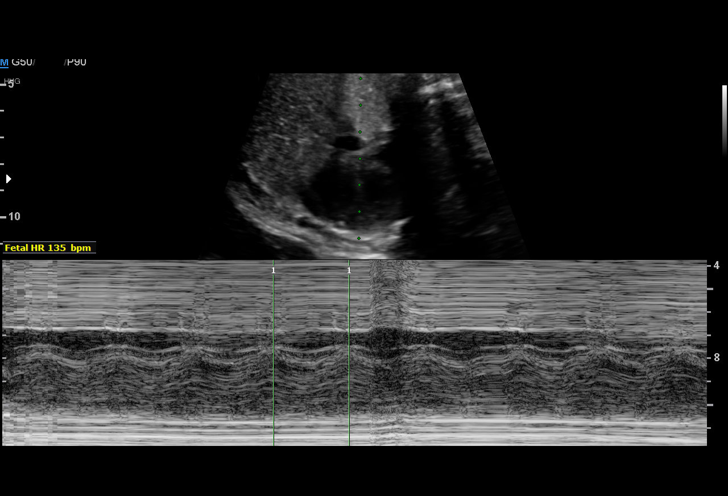
[im 48/76]
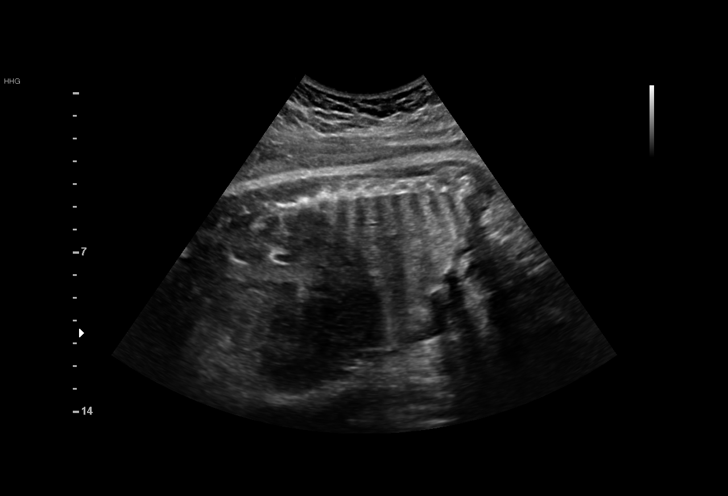
[im 53/76]
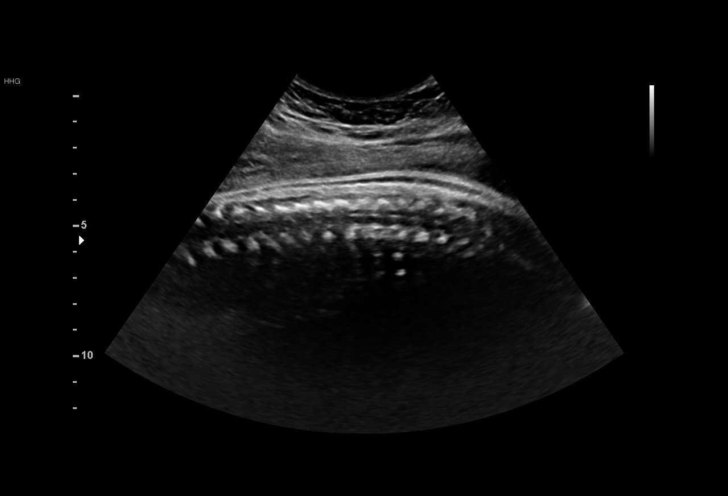
[im 59/76]
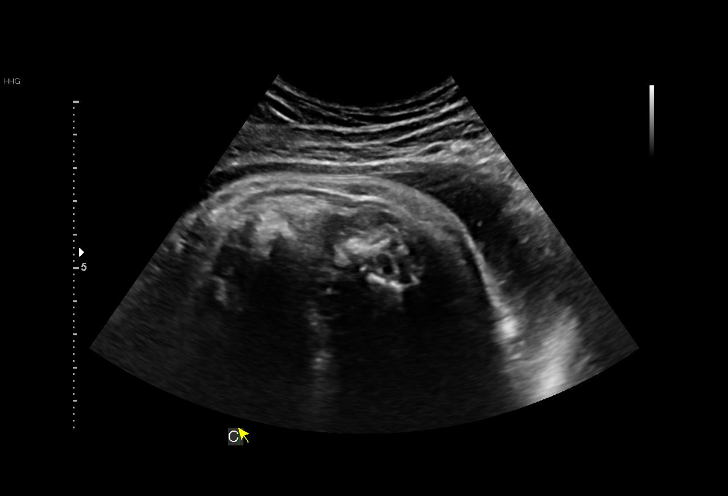
[im 64/76]
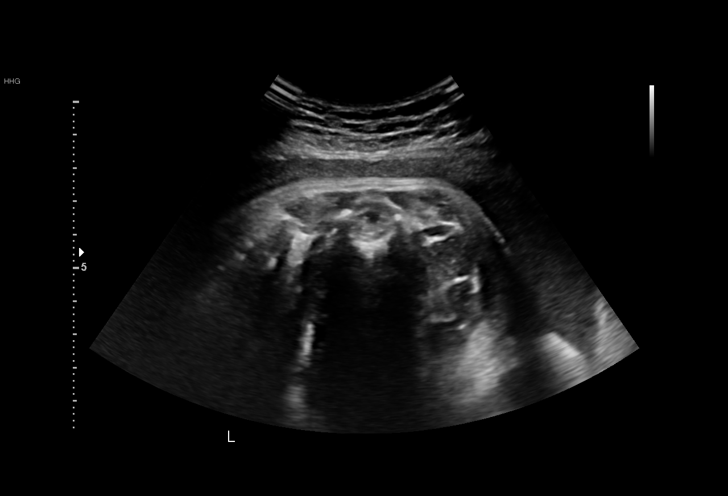
[im 70/76]
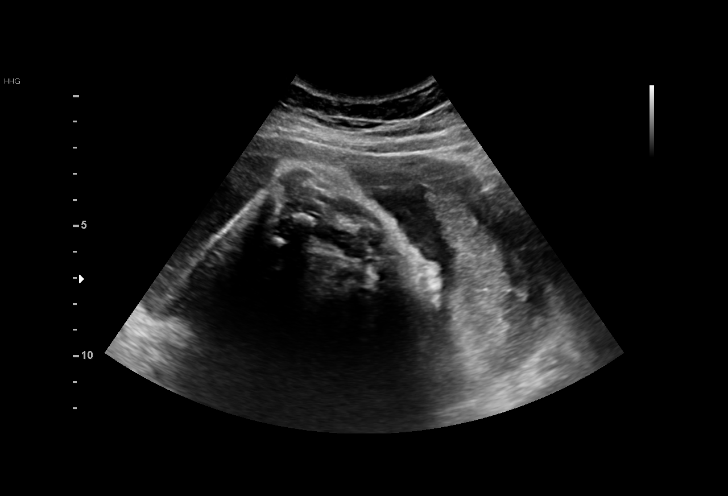
[im 76/76]
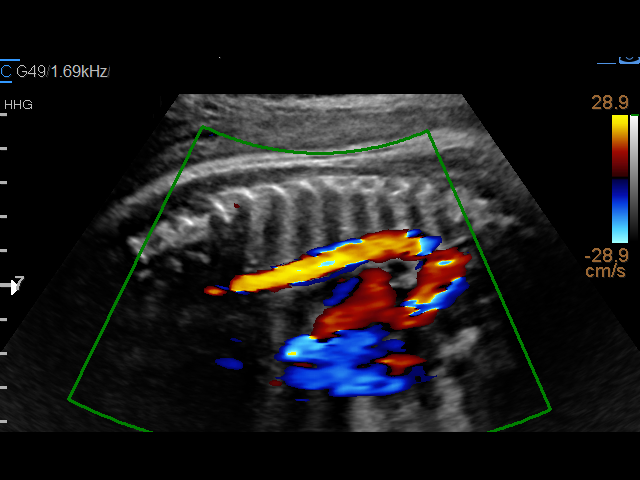

[14 of 28 positions shown; findings below may reference images not displayed]

OB/Gyn Clinic

1  [facility]            [id number]      [phone]     [id number]
Indications

38 weeks gestation of pregnancy
Poor obstetric history (prior pre-term labor
96wks)
Gestational diabetes in pregnancy,
unspecified control
Encounter for antenatal screening for
malformations
OB History

Blood Type:            Height:  4'11"  Weight (lb):  176      BMI:
Gravidity:    5         Term:   1        Prem:   1
TOP:          1       Ectopic:  1        Living: 2
Fetal Evaluation

Num Of Fetuses:     1
Fetal Heart         135
Rate(bpm):
Cardiac Activity:   Observed
Presentation:       Cephalic
Placenta:           Posterior, above cervical os
P. Cord Insertion:  Visualized, central
Amniotic Fluid
AFI FV:      Subjectively within normal limits

AFI Sum(cm)     %Tile       Largest Pocket(cm)
15.08           60

RUQ(cm)       RLQ(cm)       LUQ(cm)        LLQ(cm)
6.52
Biometry

BPD:        90  mm     G. Age:  36w 4d         20  %    CI:        76.84   %   70 - 86
FL/HC:      22.8   %   20.6 -
HC:      325.2  mm     G. Age:  36w 6d          6  %    HC/AC:      0.96       0.87 -
AC:      337.1  mm     G. Age:  37w 4d         40  %    FL/BPD:     82.4   %   71 - 87
FL:       74.2  mm     G. Age:  38w 0d         37  %    FL/AC:      22.0   %   20 - 24
HUM:      67.1  mm     G. Age:  39w 0d         80  %

Est. FW:    2221  gm      7 lb 2 oz     57  %
Gestational Age

LMP:           38w 5d       Date:   03/12/15                 EDD:   12/17/15
U/S Today:     37w 2d                                        EDD:   12/27/15
Best:          38w 5d    Det. By:   LMP  (03/12/15)          EDD:   12/17/15
Anatomy

Cranium:               Not well visualized    Aortic Arch:            Appears normal
Cavum:                 Not well visualized    Ductal Arch:            Not well visualized
Ventricles:            Not well visualized    Diaphragm:              Appears normal
Choroid Plexus:        Not well visualized    Stomach:                Appears normal, left
sided
Cerebellum:            Not well visualized    Abdomen:                Appears normal
Posterior Fossa:       Not well visualized    Abdominal Wall:         Not well visualized
Nuchal Fold:           Not applicable (>20    Cord Vessels:           Appears normal (3
wks GA)                                        vessel cord)
Face:                  Not well visualized    Kidneys:                Appear normal
Lips:                  Not well visualized    Bladder:                Appears normal
Thoracic:              Appears normal         Spine:                  Ltd views no
intracranial signs of
NT
Heart:                 Appears normal         Upper Extremities:      Visualized
(4CH, axis, and situs
RVOT:                  Appears normal         Lower Extremities:      Visualized
LVOT:                  Appears normal

Other:  Fetus appears to be a male. Technically difficult due to advanced GA
and fetal position.
Cervix Uterus Adnexa

Cervix
Not visualized (advanced GA >56wks)

Uterus
No abnormality visualized.

Left Ovary
Not visualized.
Right Ovary
Not visualized.

Adnexa:       No adnexal mass visualized.
Impression

SIUP at 38+5 weeks
Cephalic presentation
Normal detailed fetal anatomy; limited views of intracranial
structures, face, DA, CI and spine
Normal amniotic fluid volume
Measurements consistent with LMP dating; EFW at the 57th
%tile
Recommendations

Follow-up as clinically indicated
